# Patient Record
Sex: Female | Born: 1990 | Race: White | Hispanic: No | Smoking: Former smoker
Health system: Southern US, Community
[De-identification: ages and names within clinical notes are randomized; demographics above are authoritative.]

## PROBLEM LIST (undated history)

## (undated) ENCOUNTER — Inpatient Hospital Stay (HOSPITAL_COMMUNITY): Payer: Self-pay

## (undated) DIAGNOSIS — N393 Stress incontinence (female) (male): Secondary | ICD-10-CM

## (undated) DIAGNOSIS — J4540 Moderate persistent asthma, uncomplicated: Secondary | ICD-10-CM

## (undated) DIAGNOSIS — N3281 Overactive bladder: Secondary | ICD-10-CM

## (undated) DIAGNOSIS — Z8742 Personal history of other diseases of the female genital tract: Secondary | ICD-10-CM

## (undated) DIAGNOSIS — Z973 Presence of spectacles and contact lenses: Secondary | ICD-10-CM

## (undated) DIAGNOSIS — J45909 Unspecified asthma, uncomplicated: Secondary | ICD-10-CM

## (undated) DIAGNOSIS — J302 Other seasonal allergic rhinitis: Secondary | ICD-10-CM

## (undated) DIAGNOSIS — N8110 Cystocele, unspecified: Secondary | ICD-10-CM

## (undated) DIAGNOSIS — N812 Incomplete uterovaginal prolapse: Secondary | ICD-10-CM

## (undated) HISTORY — DX: Cystocele, unspecified: N81.10

## (undated) HISTORY — PX: WISDOM TOOTH EXTRACTION: SHX21

## (undated) HISTORY — PX: ABDOMINAL HYSTERECTOMY: SHX81

## (undated) HISTORY — DX: Unspecified asthma, uncomplicated: J45.909

---
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
## 2011-02-18 ENCOUNTER — Other Ambulatory Visit: Payer: Self-pay | Admitting: Family Medicine

## 2011-02-18 MED ORDER — LORATADINE 10 MG PO CAPS: ORAL_CAPSULE | ORAL | Status: DC

## 2011-03-14 ENCOUNTER — Ambulatory Visit (INDEPENDENT_AMBULATORY_CARE_PROVIDER_SITE_OTHER): Payer: BC Managed Care – PPO | Admitting: Family Medicine

## 2011-03-14 ENCOUNTER — Encounter: Payer: Self-pay | Admitting: Family Medicine

## 2011-03-14 DIAGNOSIS — R82998 Other abnormal findings in urine: Secondary | ICD-10-CM

## 2011-03-14 DIAGNOSIS — R8290 Unspecified abnormal findings in urine: Secondary | ICD-10-CM

## 2011-03-14 DIAGNOSIS — M549 Dorsalgia, unspecified: Secondary | ICD-10-CM | POA: Insufficient documentation

## 2011-03-14 LAB — POCT URINALYSIS DIPSTICK
Bilirubin, UA: NEGATIVE
Blood, UA: NEGATIVE
Glucose, UA: NEGATIVE
Ketones, UA: NEGATIVE
Leukocytes, UA: NEGATIVE
Nitrite, UA: NEGATIVE
Protein, UA: NEGATIVE
Spec Grav, UA: 1.01
Urobilinogen, UA: 0.2
pH, UA: 7.5

## 2011-03-14 MED ORDER — CYCLOBENZAPRINE HCL 10 MG PO TABS: 10.0000 mg | ORAL_TABLET | Freq: Three times a day (TID) | ORAL | Status: AC | PRN

## 2011-03-14 MED ORDER — NAPROXEN 500 MG PO TABS: 500.0000 mg | ORAL_TABLET | Freq: Two times a day (BID) | ORAL | Status: AC

## 2011-03-14 NOTE — Progress Notes (Signed)
  Subjective:    Patient ID: Amber Wilkins, female    DOB: Oct 04, 1991, 20 y.o.   MRN: [id number]  HPI ? UTI- back pain started 1 week ago.  No decreased ROM but + pain w/ movement.  No change in activity recently- denies heavy lifting.  'normal exercise'.  No fevers or chills.  About to start period.  Denies increased frequency, dysuria, blood.  + cloudy urine and strong smelling.  Reports normal water intake.   Review of Systems ROS  For ROS see HPI     Objective:   Physical Exam  Constitutional: She appears well-developed and well-nourished. No distress.  Abdominal: Soft. She exhibits no distension. There is no tenderness.       No suprapubic or CVA tenderness  Musculoskeletal:       (-) SLR Pain w/ extension>flexion, good range of motion in flexion and extension Strength normal in LEs, sensation intact          Assessment & Plan:

## 2011-03-14 NOTE — Patient Instructions (Signed)
This appears to be all muscle related Take the muscle relaxers at night and on weekend- they will make you sleepy Use the Naproxen for 7-10 days (take w/ food) and then as needed for pain Continue to drink plenty of fluids Use a heating pad for pain relief Call with any questions or concerns Hang in there!!!

## 2011-03-20 NOTE — Assessment & Plan Note (Signed)
Pain and sxs not consistent w/ UTI.  UA not suspicious for infxn.  Back pain more muscular in nature.  Start NSAIDs, muscle relaxer for night.  Reviewed supportive care and red flags that should prompt return.  Pt expressed understanding and is in agreement w/ plan.

## 2011-04-08 NOTE — Op Note (Signed)
[facility]  Patient:    Amber Wilkins, Amber Wilkins Visit Number: [id number] MRN: [id number]          Service Type: PED Location: PEDS [phone] [id number] Attending Physician:  Devoria Albe Dictated by:   Judie Petit. Leonia Corona, M.D. Proc. Date: 09/25/01 Admit Date:  09/24/2001                             Operative Report  PREOPERATIVE DIAGNOSIS:  Acute appendicitis.  POSTOPERATIVE DIAGNOSIS:  Acute appendicitis.  PROCEDURE:  Open appendectomy.  SURGEON:  Nelida Meuse, M.D.  ASSISTANT:  Nurse.  DESCRIPTION OF PROCEDURE:  The patient is brought in the operating room, placed supine upon the operating table.  General endotracheal tube anesthesia is given.  The right lower quadrant of the abdomen and the surrounding area of the abdominal wall is cleaned, prepped, and draped in the usual manner.  The incision is entered at the McBurney point and a curvilinear incision along the skin crease is made, measuring about 3-4 cm.  The skin incision is deepened through subcutaneous tissues using electrocautery until the external aponeurosis is exposed.  The external aponeurosis is incised in the line of its fibers using knife and scissors, incising the external oblique aponeurosis.  The internal oblique and the transversus abdominis fibers are split along their fibers with the help of a blunt-tipped hemostat and then retracting the fibers with the help of [employer] retractor blade.  After retracting the internal and transversus abdominis muscle fibers, the peritoneum is visualized with the help of two hemostats and incising in between with the help of scissors.  A serosanguineous fluid exuded immediately upon opening the peritoneum, and the omentum was visualized just beneath, confirming the presence of an acute inflammatory deposit.  The blades of the retractor were inserted into the peritoneal cavity and stretched.  The fluid was suctioned out.  The right index finger was  introduced to feel for the appendix.  The appendix was found to be retrocecal, severely inflamed, with a bulbous and swollen tip.  Mobilization of the cecum did not help enough to deliver the appendix, which completely hidden behind the peritoneum.  We therefore held the cecum and the base of the appendix, which was partly delivered through the incision.  Base of the appendix was cleared with the help of a blunt-tipped hemostat and ligated below a clamp using 2-0 Vicryl and then divided, and after dividing the base of the appendix on the cecal wall, it was easier for [place] to do a retrograde appendectomy.  We carefully packed the cecum off and then started dividing the mesoappendix and the small bits using ligature of 3-0 silk and dividing until the mesoappendix was completely divided and the appendix was gradually freed from the retroperitoneum.  Once partially freed, the tip was held up with [place] and then the _____ appendix was pulled partially through the wound and the remaining attachment with the mesoappendix was clamped and divided.  The appendix was freed from the _____ and removed from the field, and mesoappendix was ligated using 3-0 silk.  Complete hemostasis was noticed.  There was no oozing or bleeding noted.  The peritoneum was irrigated with warm saline and completely suctioned out.  Peritoneal fluid was clear.  The base of the appendix was once again held up and a pursestring suture using 3-0 silk was made, and the stump was buried by tightening the pursestring suture, and the cecum  was returned back into the peritoneal cavity once again.  The anterior of the peritoneum was inspected for any oozing or bleeding, and none was noted.  Once again the peritoneal cavity was irrigated and suctioned out completely, and then the peritoneum was closed using 2-0 Vicryl running stitch and the internal oblique and transversus abdominis muscles were approximated using three  interrupted sutures of 2-0 Vicryl.  The external oblique aponeurosis was repaired using 2-0 Vicryl interrupted stitch.  The wound was once again irrigated out and then approximately 15 cc of 0.25% Marcaine with epinephrine was infiltrated in and around the incision for postoperative pain control.  The wound was closed in layers, the subcutaneous layer using 4-0 Vicryl interrupted stitches and a 5-0 Monocryl subcuticular stitch.  Steri-Strips were applied, which was covered with sterile gauze.  The patient tolerated the procedure very well, which was smooth and uneventful.  The patient was later extubated and transported to the recovery room in good and stable condition. Dictated by:   Judie Petit. Leonia Corona, M.D. Attending Physician:  Devoria Albe DD:  09/25/01 TD:  09/26/01 Job: [id number]

## 2011-12-21 ENCOUNTER — Other Ambulatory Visit: Payer: Self-pay | Admitting: Family Medicine

## 2011-12-22 NOTE — Telephone Encounter (Signed)
.  left message to have patient return my call. Per noted pt has not had pap smear and last one noted with GYN,need to clarify if she has a GYN

## 2011-12-26 NOTE — Telephone Encounter (Signed)
Called pt and spoke to pt mother, I advised I was [facility] nurse and that I was calling to speak with pt, mother asked if this was about her birth control pills because she figured out that the fax must have went to [facility], however her mother is not listed as a contact to give information to, advised to have pt call office with any questions or concerns, pt mother advised she would tell pt

## 2013-08-12 ENCOUNTER — Encounter (HOSPITAL_COMMUNITY): Payer: Self-pay

## 2013-08-12 ENCOUNTER — Inpatient Hospital Stay (HOSPITAL_COMMUNITY)
Admission: AD | Admit: 2013-08-12 | Discharge: 2013-08-12 | Disposition: A | Discharge status: Home / Self Care | Payer: Medicaid Other | Source: Ambulatory Visit | Attending: Obstetrics and Gynecology | Admitting: Obstetrics and Gynecology

## 2013-08-12 DIAGNOSIS — Z3201 Encounter for pregnancy test, result positive: Secondary | ICD-10-CM

## 2013-08-12 HISTORY — DX: Unspecified asthma, uncomplicated: J45.909

## 2013-08-12 HISTORY — DX: Other seasonal allergic rhinitis: J30.2

## 2013-08-12 NOTE — MAU Note (Signed)
Patient states she has had 2 positive home pregnancy tests. Wants confirmation. Denies pain, bleeding or discharge. Has some nausea, no vomiting.

## 2013-08-12 NOTE — MAU Provider Note (Signed)
Chief Complaint: Possible Pregnancy   First Provider Initiated Contact with Patient 08/12/13 1714     SUBJECTIVE HPI: Amber Wilkins is a 22 y.o. G1P0 at [redacted]w[redacted]d by LMP who presents to maternity admissions reporting positive HPT.  She also has cough with congestion which has improved in last 24 hours.  She denies abdominal pain, vaginal bleeding, vaginal itching/burning, urinary symptoms, h/a, dizziness, n/v, or fever/chills.    Past Medical History  Diagnosis Date  . Seasonal allergies   . Bronchitis, allergic    Past Surgical History  Procedure Laterality Date  . Appendectomy  2002   History   Social History  . Marital Status: Single    Spouse Name: N/A    Number of Children: N/A  . Years of Education: N/A   Occupational History  . Not on file.   Social History Main Topics  . Smoking status: Former Smoker -- 0.25 packs/day for 1 years    Types: Cigarettes    Quit date: 07/12/2013  . Smokeless tobacco: Not on file  . Alcohol Use: No  . Drug Use: Yes    Special: Marijuana     Comment: august 2014  . Sexual Activity: Yes   Other Topics Concern  . Not on file   Social History Narrative   Lives with mom and dad.    Student at [employer].   No current facility-administered medications on file prior to encounter.   No current outpatient prescriptions on file prior to encounter.   Allergies  Allergen Reactions  . Penicillins Hives and Nausea And Vomiting    ROS: Pertinent items in HPI  OBJECTIVE Blood pressure 137/82, pulse 68, temperature 97.8 F (36.6 C), temperature source Oral, resp. rate 16, height 5' 1.75" (1.568 m), weight 158 lb (71.668 kg), last menstrual period 06/22/2013, SpO2 100.00%. GENERAL: Well-developed, well-nourished female in no acute distress.  HEENT: Normocephalic HEART: normal rate RESP: normal effort, lung sounds clear and equal bilaterally ABDOMEN: Soft, non-tender EXTREMITIES: Nontender, no edema NEURO: Alert and oriented SPECULUM  EXAM: deferred  LAB RESULTS Results for orders placed during the hospital encounter of 08/12/13 (from the past 24 hour(s))  POCT PREGNANCY, URINE     Status: Abnormal   Collection Time    08/12/13  4:02 PM      Result Value Range   Preg Test, Ur POSITIVE (*) NEGATIVE    ASSESSMENT 1. Positive pregnancy test     PLAN Message sent to [facility] office to make prenatal appointment Discharge home Pregnancy verification letter provided Drink plenty of water, antihistamines Ok (Benadryl, Zyrtec, Claritin) and Tylenol as needed Return to MAU as needed       Follow-up Information   Follow up with [facility]. (The clinic will call you with appointment or call the number below.  Return to MAU as needed.)    Specialty:  Obstetrics and Gynecology   Contact information:   [address] [place] [address] [phone]      Sharen Counter Certified Nurse-Midwife 08/12/2013  5:42 PM

## 2013-08-14 NOTE — MAU Provider Note (Signed)
Attestation of Attending Supervision of Advanced Practitioner (CNM/NP): Evaluation and management procedures were performed by the Advanced Practitioner under my supervision and collaboration.  I have reviewed the Advanced Practitioner's note and chart, and I agree with the management and plan.  Teruko Joswick 08/14/2013 10:29 AM

## 2013-08-26 ENCOUNTER — Encounter: Payer: Self-pay | Admitting: *Deleted

## 2013-08-26 ENCOUNTER — Ambulatory Visit (INDEPENDENT_AMBULATORY_CARE_PROVIDER_SITE_OTHER): Payer: Medicaid Other | Admitting: *Deleted

## 2013-08-26 VITALS — BP 122/66 | Wt 161.0 lb

## 2013-08-26 DIAGNOSIS — Z3401 Encounter for supervision of normal first pregnancy, first trimester: Secondary | ICD-10-CM

## 2013-08-26 DIAGNOSIS — Z3400 Encounter for supervision of normal first pregnancy, unspecified trimester: Secondary | ICD-10-CM

## 2013-08-26 LAB — HIV ANTIBODY (ROUTINE TESTING W REFLEX): HIV: NONREACTIVE

## 2013-08-26 NOTE — Patient Instructions (Signed)
Pregnancy - First Trimester  During sexual intercourse, millions of sperm go into the vagina. Only 1 sperm will penetrate and fertilize the female egg while it is in the Fallopian tube. One week later, the fertilized egg implants into the wall of the uterus. An embryo begins to develop into a baby. At 6 to 8 weeks, the eyes and face are formed and the heartbeat can be seen on ultrasound. At the end of 12 weeks (first trimester), all the baby's organs are formed. Now that you are pregnant, you will want to do everything you can to have a healthy baby. Two of the most important things are to get good prenatal care and follow your caregiver's instructions. Prenatal care is all the medical care you receive before the baby's birth. It is given to prevent, find, and treat problems during the pregnancy and childbirth.  PRENATAL EXAMS  · During prenatal visits, your weight, blood pressure, and urine are checked. This is done to make sure you are healthy and progressing normally during the pregnancy.  · A pregnant woman should gain 25 to 35 pounds during the pregnancy. However, if you are overweight or underweight, your caregiver will advise you regarding your weight.  · Your caregiver will ask and answer questions for you.  · Blood work, cervical cultures, other necessary tests, and a Pap test are done during your prenatal exams. These tests are done to check on your health and the probable health of your baby. Tests are strongly recommended and done for HIV with your permission. This is the virus that causes AIDS. These tests are done because medicines can be given to help prevent your baby from being born with this infection should you have been infected without knowing it. Blood work is also used to find out your blood type, previous infections, and follow your blood levels (hemoglobin).  · Low hemoglobin (anemia) is common during pregnancy. Iron and vitamins are given to help prevent this. Later in the pregnancy, blood  tests for diabetes will be done along with any other tests if any problems develop.  · You may need other tests to make sure you and the baby are doing well.  CHANGES DURING THE FIRST TRIMESTER   Your body goes through many changes during pregnancy. They vary from person to person. Talk to your caregiver about changes you notice and are concerned about. Changes can include:  · Your menstrual period stops.  · The egg and sperm carry the genes that determine what you look like. Genes from you and your partner are forming a baby. The female genes determine whether the baby is a boy or a girl.  · Your body increases in girth and you may feel bloated.  · Feeling sick to your stomach (nauseous) and throwing up (vomiting). If the vomiting is uncontrollable, call your caregiver.  · Your breasts will begin to enlarge and become tender.  · Your nipples may stick out more and become darker.  · The need to urinate more. Painful urination may mean you have a bladder infection.  · Tiring easily.  · Loss of appetite.  · Cravings for certain kinds of food.  · At first, you may gain or lose a couple of pounds.  · You may have changes in your emotions from day to day (excited to be pregnant or concerned something may go wrong with the pregnancy and baby).  · You may have more vivid and strange dreams.  HOME CARE INSTRUCTIONS   ·   It is very important to avoid all smoking, alcohol and non-prescribed drugs during your pregnancy. These affect the formation and growth of the baby. Avoid chemicals while pregnant to ensure the delivery of a healthy infant.  · Start your prenatal visits by the 12th week of pregnancy. They are usually scheduled monthly at first, then more often in the last 2 months before delivery. Keep your caregiver's appointments. Follow your caregiver's instructions regarding medicine use, blood and lab tests, exercise, and diet.  · During pregnancy, you are providing food for you and your baby. Eat regular, well-balanced  meals. Choose foods such as meat, fish, milk and other low fat dairy products, vegetables, fruits, and whole-grain breads and cereals. Your caregiver will tell you of the ideal weight gain.  · You can help morning sickness by keeping soda crackers at the bedside. Eat a couple before arising in the morning. You may want to use the crackers without salt on them.  · Eating 4 to 5 small meals rather than 3 large meals a day also may help the nausea and vomiting.  · Drinking liquids between meals instead of during meals also seems to help nausea and vomiting.  · A physical sexual relationship may be continued throughout pregnancy if there are no other problems. Problems may be early (premature) leaking of amniotic fluid from the membranes, vaginal bleeding, or belly (abdominal) pain.  · Exercise regularly if there are no restrictions. Check with your caregiver or physical therapist if you are unsure of the safety of some of your exercises. Greater weight gain will occur in the last 2 trimesters of pregnancy. Exercising will help:  · Control your weight.  · Keep you in shape.  · Prepare you for labor and delivery.  · Help you lose your pregnancy weight after you deliver your baby.  · Wear a good support or jogging bra for breast tenderness during pregnancy. This may help if worn during sleep too.  · Ask when prenatal classes are available. Begin classes when they are offered.  · Do not use hot tubs, steam rooms, or saunas.  · Wear your seat belt when driving. This protects you and your baby if you are in an accident.  · Avoid raw meat, uncooked cheese, cat litter boxes, and soil used by cats throughout the pregnancy. These carry germs that can cause birth defects in the baby.  · The first trimester is a good time to visit your dentist for your dental health. Getting your teeth cleaned is okay. Use a softer toothbrush and brush gently during pregnancy.  · Ask for help if you have financial, counseling, or nutritional needs  during pregnancy. Your caregiver will be able to offer counseling for these needs as well as refer you for other special needs.  · Do not take any medicines or herbs unless told by your caregiver.  · Inform your caregiver if there is any mental or physical domestic violence.  · Make a list of emergency phone numbers of family, friends, hospital, and police and fire departments.  · Write down your questions. Take them to your prenatal visit.  · Do not douche.  · Do not cross your legs.  · If you have to stand for long periods of time, rotate you feet or take small steps in a circle.  · You may have more vaginal secretions that may require a sanitary pad. Do not use tampons or scented sanitary pads.  MEDICINES AND DRUG USE IN PREGNANCY  ·   Take prenatal vitamins as directed. The vitamin should contain 1 milligram of folic acid. Keep all vitamins out of reach of children. Only a couple vitamins or tablets containing iron may be fatal to a baby or young child when ingested.  · Avoid use of all medicines, including herbs, over-the-counter medicines, not prescribed or suggested by your caregiver. Only take over-the-counter or prescription medicines for pain, discomfort, or fever as directed by your caregiver. Do not use aspirin, ibuprofen, or naproxen unless directed by your caregiver.  · Let your caregiver also know about herbs you may be using.  · Alcohol is related to a number of birth defects. This includes fetal alcohol syndrome. All alcohol, in any form, should be avoided completely. Smoking will cause low birth rate and premature babies.  · Street or illegal drugs are very harmful to the baby. They are absolutely forbidden. A baby born to an addicted mother will be addicted at birth. The baby will go through the same withdrawal an adult does.  · Let your caregiver know about any medicines that you have to take and for what reason you take them.  SEEK MEDICAL CARE IF:   You have any concerns or worries during your  pregnancy. It is better to call with your questions if you feel they cannot wait, rather than worry about them.  SEEK IMMEDIATE MEDICAL CARE IF:   · An unexplained oral temperature above 102° F (38.9° C) develops, or as your caregiver suggests.  · You have leaking of fluid from the vagina (birth canal). If leaking membranes are suspected, take your temperature and inform your caregiver of this when you call.  · There is vaginal spotting or bleeding. Notify your caregiver of the amount and how many pads are used.  · You develop a bad smelling vaginal discharge with a change in the color.  · You continue to feel sick to your stomach (nauseated) and have no relief from remedies suggested. You vomit blood or coffee ground-like materials.  · You lose more than 2 pounds of weight in 1 week.  · You gain more than 2 pounds of weight in 1 week and you notice swelling of your face, hands, feet, or legs.  · You gain 5 pounds or more in 1 week (even if you do not have swelling of your hands, face, legs, or feet).  · You get exposed to German measles and have never had them.  · You are exposed to fifth disease or chickenpox.  · You develop belly (abdominal) pain. Round ligament discomfort is a common non-cancerous (benign) cause of abdominal pain in pregnancy. Your caregiver still must evaluate this.  · You develop headache, fever, diarrhea, pain with urination, or shortness of breath.  · You fall or are in a car accident or have any kind of trauma.  · There is mental or physical violence in your home.  Document Released: 11/01/2001 Document Revised: 08/01/2012 Document Reviewed: 05/05/2009  [employer]® Patient Information ©2014 [employer].

## 2013-08-26 NOTE — Progress Notes (Signed)
Patient is here today for New OB visit.  She is doing well with just a little bit of nausea in the mornings.  She feels like it is stomach acid related so she will try an anti acid to see if this helps and will let us know at her next visit.  Bedside ultrasound shows crown rump length of 7weeks 2 days, which differs from her LMP by two weeks.  She would like to have first trimester screening and will set that up at her next visit with the physician.  Routine blood work was drawn and patient will follow up next week to see the physician and she will call if she has any further questions or concerns before her next visit.

## 2013-08-27 LAB — OBSTETRIC PANEL
Antibody Screen: NEGATIVE
Basophils Relative: 0 % (ref 0–1)
Eosinophils Absolute: 0.1 10*3/uL (ref 0.0–0.7)
Eosinophils Relative: 1 % (ref 0–5)
HCT: 39.2 % (ref 36.0–46.0)
Hemoglobin: 13.6 g/dL (ref 12.0–15.0)
MCH: 32.9 pg (ref 26.0–34.0)
MCHC: 34.7 g/dL (ref 30.0–36.0)
Monocytes Absolute: 0.7 10*3/uL (ref 0.1–1.0)
Monocytes Relative: 7 % (ref 3–12)
Platelets: 326 10*3/uL (ref 150–400)
Rh Type: POSITIVE
WBC: 10.3 10*3/uL (ref 4.0–10.5)

## 2013-08-27 LAB — CULTURE, OB URINE: Colony Count: NO GROWTH

## 2013-08-29 LAB — CYSTIC FIBROSIS DIAGNOSTIC STUDY

## 2013-09-03 ENCOUNTER — Encounter: Payer: Self-pay | Admitting: Obstetrics & Gynecology

## 2013-09-03 ENCOUNTER — Ambulatory Visit (INDEPENDENT_AMBULATORY_CARE_PROVIDER_SITE_OTHER): Payer: Medicaid Other | Admitting: Obstetrics & Gynecology

## 2013-09-03 VITALS — BP 103/61 | Wt 158.0 lb

## 2013-09-03 DIAGNOSIS — N898 Other specified noninflammatory disorders of vagina: Secondary | ICD-10-CM

## 2013-09-03 DIAGNOSIS — Z3490 Encounter for supervision of normal pregnancy, unspecified, unspecified trimester: Secondary | ICD-10-CM | POA: Insufficient documentation

## 2013-09-03 DIAGNOSIS — O9989 Other specified diseases and conditions complicating pregnancy, childbirth and the puerperium: Secondary | ICD-10-CM

## 2013-09-03 DIAGNOSIS — Z3491 Encounter for supervision of normal pregnancy, unspecified, first trimester: Secondary | ICD-10-CM

## 2013-09-03 DIAGNOSIS — Z3480 Encounter for supervision of other normal pregnancy, unspecified trimester: Secondary | ICD-10-CM

## 2013-09-03 NOTE — Patient Instructions (Signed)
Pregnancy - First Trimester During sexual intercourse, millions of sperm go into the vagina. Only 1 sperm will penetrate and fertilize the female egg while it is in the Fallopian tube. One week later, the fertilized egg implants into the wall of the uterus. An embryo begins to develop into a baby. At 6 to 8 weeks, the eyes and face are formed and the heartbeat can be seen on ultrasound. At the end of 12 weeks (first trimester), all the baby's organs are formed. Now that you are pregnant, you will want to do everything you can to have a healthy baby. Two of the most important things are to get good prenatal care and follow your caregiver's instructions. Prenatal care is all the medical care you receive before the baby's birth. It is given to prevent, find, and treat problems during the pregnancy and childbirth. PRENATAL EXAMS  During prenatal visits, your weight, blood pressure, and urine are checked. This is done to make sure you are healthy and progressing normally during the pregnancy.  A pregnant woman should gain 25 to 35 pounds during the pregnancy. However, if you are overweight or underweight, your caregiver will advise you regarding your weight.  Your caregiver will ask and answer questions for you.  Blood work, cervical cultures, other necessary tests, and a Pap test are done during your prenatal exams. These tests are done to check on your health and the probable health of your baby. Tests are strongly recommended and done for HIV with your permission. This is the virus that causes AIDS. These tests are done because medicines can be given to help prevent your baby from being born with this infection should you have been infected without knowing it. Blood work is also used to find out your blood type, previous infections, and follow your blood levels (hemoglobin).  Low hemoglobin (anemia) is common during pregnancy. Iron and vitamins are given to help prevent this. Later in the pregnancy,  blood tests for diabetes will be done along with any other tests if any problems develop.  You may need other tests to make sure you and the baby are doing well. CHANGES DURING THE FIRST TRIMESTER  Your body goes through many changes during pregnancy. They vary from person to person. Talk to your caregiver about changes you notice and are concerned about. Changes can include:  Your menstrual period stops.  The egg and sperm carry the genes that determine what you look like. Genes from you and your partner are forming a baby. The female genes determine whether the baby is a boy or a girl.  Your body increases in girth and you may feel bloated.  Feeling sick to your stomach (nauseous) and throwing up (vomiting). If the vomiting is uncontrollable, call your caregiver.  Your breasts will begin to enlarge and become tender.  Your nipples may stick out more and become darker.  The need to urinate more. Painful urination may mean you have a bladder infection.  Tiring easily.  Loss of appetite.  Cravings for certain kinds of food.  At first, you may gain or lose a couple of pounds.  You may have changes in your emotions from day to day (excited to be pregnant or concerned something may go wrong with the pregnancy and baby).  You may have more vivid and strange dreams. HOME CARE INSTRUCTIONS   It is very important to avoid all smoking, alcohol and non-prescribed drugs during your pregnancy. These affect the formation and growth of the baby.   Avoid chemicals while pregnant to ensure the delivery of a healthy infant.  Start your prenatal visits by the 12th week of pregnancy. They are usually scheduled monthly at first, then more often in the last 2 months before delivery. Keep your caregiver's appointments. Follow your caregiver's instructions regarding medicine use, blood and lab tests, exercise, and diet.  During pregnancy, you are providing food for you and your baby. Eat regular,  well-balanced meals. Choose foods such as meat, fish, milk and other low fat dairy products, vegetables, fruits, and whole-grain breads and cereals. Your caregiver will tell you of the ideal weight gain.  You can help morning sickness by keeping soda crackers at the bedside. Eat a couple before arising in the morning. You may want to use the crackers without salt on them.  Eating 4 to 5 small meals rather than 3 large meals a day also may help the nausea and vomiting.  Drinking liquids between meals instead of during meals also seems to help nausea and vomiting.  A physical sexual relationship may be continued throughout pregnancy if there are no other problems. Problems may be early (premature) leaking of amniotic fluid from the membranes, vaginal bleeding, or belly (abdominal) pain.  Exercise regularly if there are no restrictions. Check with your caregiver or physical therapist if you are unsure of the safety of some of your exercises. Greater weight gain will occur in the last 2 trimesters of pregnancy. Exercising will help:  Control your weight.  Keep you in shape.  Prepare you for labor and delivery.  Help you lose your pregnancy weight after you deliver your baby.  Wear a good support or jogging bra for breast tenderness during pregnancy. This may help if worn during sleep too.  Ask when prenatal classes are available. Begin classes when they are offered.  Do not use hot tubs, steam rooms, or saunas.  Wear your seat belt when driving. This protects you and your baby if you are in an accident.  Avoid raw meat, uncooked cheese, cat litter boxes, and soil used by cats throughout the pregnancy. These carry germs that can cause birth defects in the baby.  The first trimester is a good time to visit your dentist for your dental health. Getting your teeth cleaned is okay. Use a softer toothbrush and brush gently during pregnancy.  Ask for help if you have financial, counseling, or  nutritional needs during pregnancy. Your caregiver will be able to offer counseling for these needs as well as refer you for other special needs.  Do not take any medicines or herbs unless told by your caregiver.  Inform your caregiver if there is any mental or physical domestic violence.  Make a list of emergency phone numbers of family, friends, hospital, and police and fire departments.  Write down your questions. Take them to your prenatal visit.  Do not douche.  Do not cross your legs.  If you have to stand for long periods of time, rotate you feet or take small steps in a circle.  You may have more vaginal secretions that may require a sanitary pad. Do not use tampons or scented sanitary pads. MEDICINES AND DRUG USE IN PREGNANCY  Take prenatal vitamins as directed. The vitamin should contain 1 milligram of folic acid. Keep all vitamins out of reach of children. Only a couple vitamins or tablets containing iron may be fatal to a baby or young child when ingested.  Avoid use of all medicines, including herbs, over-the-counter medicines, not   prescribed or suggested by your caregiver. Only take over-the-counter or prescription medicines for pain, discomfort, or fever as directed by your caregiver. Do not use aspirin, ibuprofen, or naproxen unless directed by your caregiver.  Let your caregiver also know about herbs you may be using.  Alcohol is related to a number of birth defects. This includes fetal alcohol syndrome. All alcohol, in any form, should be avoided completely. Smoking will cause low birth rate and premature babies.  Street or illegal drugs are very harmful to the baby. They are absolutely forbidden. A baby born to an addicted mother will be addicted at birth. The baby will go through the same withdrawal an adult does.  Let your caregiver know about any medicines that you have to take and for what reason you take them. SEEK MEDICAL CARE IF:  You have any concerns or  worries during your pregnancy. It is better to call with your questions if you feel they cannot wait, rather than worry about them. SEEK IMMEDIATE MEDICAL CARE IF:   An unexplained oral temperature above 102 F (38.9 C) develops, or as your caregiver suggests.  You have leaking of fluid from the vagina (birth canal). If leaking membranes are suspected, take your temperature and inform your caregiver of this when you call.  There is vaginal spotting or bleeding. Notify your caregiver of the amount and how many pads are used.  You develop a bad smelling vaginal discharge with a change in the color.  You continue to feel sick to your stomach (nauseated) and have no relief from remedies suggested. You vomit blood or coffee ground-like materials.  You lose more than 2 pounds of weight in 1 week.  You gain more than 2 pounds of weight in 1 week and you notice swelling of your face, hands, feet, or legs.  You gain 5 pounds or more in 1 week (even if you do not have swelling of your hands, face, legs, or feet).  You get exposed to [place] measles and have never had them.  You are exposed to fifth disease or chickenpox.  You develop belly (abdominal) pain. Round ligament discomfort is a common non-cancerous (benign) cause of abdominal pain in pregnancy. Your caregiver still must evaluate this.  You develop headache, fever, diarrhea, pain with urination, or shortness of breath.  You fall or are in a car accident or have any kind of trauma.  There is mental or physical violence in your home. Document Released: 11/01/2001 Document Revised: 08/01/2012 Document Reviewed: 05/05/2009 [facility] Patient Information 2014 [place], [place].   Influenza Vaccine (Flu Vaccine, Inactivated) 2013 2014 What You Need to Know WHY GET VACCINATED?  Influenza ("flu") is a contagious disease that spreads around the [place] every winter, usually between October and May.  Flu is caused by the influenza  virus, and can be spread by coughing, sneezing, and close contact.  Anyone can get flu, but the risk of getting flu is highest among children. Symptoms come on suddenly and may last several days. They can include:  Fever or chills.  Sore throat.  Muscle aches.  Fatigue.  Cough.  Headache.  Runny or stuffy nose. Flu can make some people much sicker than others. These people include young children, people 2 and older, pregnant women, and people with certain health conditions such as heart, lung or kidney disease, or a weakened immune system. Flu vaccine is especially important for these people, and anyone in close contact with them. Flu can also lead to pneumonia,  and make existing medical conditions worse. It can cause diarrhea and seizures in children. Each year thousands of people in the [place] die from flu, and many more are hospitalized. Flu vaccine is the best protection we have from flu and its complications. Flu vaccine also helps prevent spreading flu from person to person. INACTIVATED FLU VACCINE There are 2 types of influenza vaccine:  You are getting an inactivated flu vaccine, which does not contain any live influenza virus. It is given by injection with a needle, and often called the "flu shot."  A different live, attenuated (weakened) influenza vaccine is sprayed into the nostrils. This vaccine is described in a separate Vaccine Information Statement. Flu vaccine is recommended every year. Children 6 months through 44 years of age should get 2 doses the first year they get vaccinated. Flu viruses are always changing. Each year's flu vaccine is made to protect from viruses that are most likely to cause disease that year. While flu vaccine cannot prevent all cases of flu, it is our best defense against the disease. Inactivated flu vaccine protects against 3 or 4 different influenza viruses. It takes about 2 weeks for protection to develop after the vaccination, and  protection lasts several months to a year. Some illnesses that are not caused by influenza virus are often mistaken for flu. Flu vaccine will not prevent these illnesses. It can only prevent influenza. A "high-dose" flu vaccine is available for people 80 years of age and older. The person giving you the vaccine can tell you more about it. Some inactivated flu vaccine contains a very small amount of a mercury-based preservative called thimerosal. Studies have shown that thimerosal in vaccines is not harmful, but flu vaccines that do not contain a preservative are available. SOME PEOPLE SHOULD NOT GET THIS VACCINE Tell the person who gives you the vaccine:  If you have any severe (life-threatening) allergies. If you ever had a life-threatening allergic reaction after a dose of flu vaccine, or have a severe allergy to any part of this vaccine, you may be advised not to get a dose. Most, but not all, types of flu vaccine contain a small amount of egg.  If you ever had Guillain Barr Syndrome (a severe paralyzing illness, also called GBS). Some people with a history of GBS should not get this vaccine. This should be discussed with your doctor.  If you are not feeling well. They might suggest waiting until you feel better. But you should come back. RISKS OF A VACCINE REACTION With a vaccine, like any medicine, there is a chance of side effects. These are usually mild and go away on their own. Serious side effects are also possible, but are very rare. Inactivated flu vaccine does not contain live flu virus, sogetting flu from this vaccine is not possible. Brief fainting spells and related symptoms (such as jerking movements) can happen after any medical procedure, including vaccination. Sitting or lying down for about 15 minutes after a vaccination can help prevent fainting and injuries caused by falls. Tell your doctor if you feel dizzy or lightheaded, or have vision changes or ringing in the ears. Mild  problems following inactivated flu vaccine:  Soreness, redness, or swelling where the shot was given.  Hoarseness; sore, red or itchy eyes; or cough.  Fever.  Aches.  Headache.  Itching.  Fatigue. If these problems occur, they usually begin soon after the shot and last 1 or 2 days. Moderate problems following inactivated flu vaccine:  Young  children who get inactivated flu vaccine and pneumococcal vaccine (PCV13) at the same time may be at increased risk for seizures caused by fever. Ask your doctor for more information. Tell your doctor if a child who is getting flu vaccine has ever had a seizure. Severe problems following inactivated flu vaccine:  A severe allergic reaction could occur after any vaccine (estimated less than 1 in a million doses).  There is a small possibility that inactivated flu vaccine could be associated with Guillan Barr Syndrome (GBS), no more than 1 or 2 cases per million people vaccinated. This is much lower than the risk of severe complications from flu, which can be prevented by flu vaccine. The safety of vaccines is always being monitored. For more information, visit: http://floyd.org/ WHAT IF THERE IS A SERIOUS REACTION? What should I look for?  Look for anything that concerns you, such as signs of a severe allergic reaction, very high fever, or behavior changes. Signs of a severe allergic reaction can include hives, swelling of the face and throat, difficulty breathing, a fast heartbeat, dizziness, and weakness. These would start a few minutes to a few hours after the vaccination. What should I do?  If you think it is a severe allergic reaction or other emergency that cannot wait, call 9 1 1  or get the person to the nearest hospital. Otherwise, call your doctor.  Afterward, the reaction should be reported to the Vaccine Adverse Event Reporting System (VAERS). Your doctor might file this report, or you can do it yourself through the VAERS  website at www.vaers.LAgents.no, or by calling 1-2364797521. VAERS is only for reporting reactions. They do not give medical advice. THE NATIONAL VACCINE INJURY COMPENSATION PROGRAM The National Vaccine Injury Compensation Program (VICP) is a federal program that was created to compensate people who may have been injured by certain vaccines. Persons who believe they may have been injured by a vaccine can learn about the program and about filing a claim by calling 1-(320)514-7900 or visiting the VICP website at SpiritualWord.at HOW CAN I LEARN MORE?  Ask your doctor.  Call your local or state health department.  Contact the Centers for Disease Control and Prevention (CDC):  Call 706-738-7206 (1-800-CDC-INFO) or  Visit CDC's website at BiotechRoom.com.cy CDC Inactivated Influenza Vaccine Interim VIS (06/15/12) Document Released: 09/01/2006 Document Revised: 08/01/2012 Document Reviewed: 06/15/2012 [facility] Patient Information 2014 [place], [place].

## 2013-09-03 NOTE — Progress Notes (Signed)
   Subjective:    Amber Wilkins is a G1P0 at [redacted]w[redacted]d being seen today for her first obstetrical visit.  Patient reports increased white-green vaginal discharge with odor, no pruritus. No other symptoms.  Filed Vitals:   09/03/13 0939  BP: 103/61  Weight: 158 lb (71.668 kg)    HISTORY: OB History  Gravida Para Term Preterm AB SAB TAB Ectopic Multiple Living  1             # Outcome Date GA Lbr Len/2nd Weight Sex Delivery Anes PTL Lv  1 CUR              Past Medical History  Diagnosis Date  . Seasonal allergies   . Bronchitis, allergic    Past Surgical History  Procedure Laterality Date  . Appendectomy  2002   Family History  Problem Relation Age of Onset  . Seizures Mother   . Ovarian cancer Mother   . Cervical cancer Mother   . Lupus Mother   . Kidney disease Mother   . Hypertension Father   . Hypertension Paternal Grandmother   . Stroke Paternal Grandfather   . Hypertension Paternal Grandfather   . Heart disease Maternal Grandmother      Exam    Uterus:     Pelvic Exam:    Perineum: No Hemorrhoids, Normal Perineum   Vulva: normal   Vagina:  normal mucosa, white discharge, GC/Chlam and wet prep done   Cervix: nulliparous appearance   Adnexa: normal adnexa and no mass, fullness, tenderness   Bony Pelvis: average  System: Breast:  normal appearance, no masses or tenderness   Skin: normal coloration and turgor, no rashes   Neurologic: oriented, normal   Extremities: normal strength, tone, and muscle mass   HEENT PERRLA   Mouth/Teeth mucous membranes moist, pharynx normal without lesions and dental hygiene good   Neck supple and no masses   Cardiovascular: regular rate and rhythm   Respiratory:  appears well, vitals normal, no respiratory distress, acyanotic, normal RR, chest clear, no wheezing, crepitations, rhonchi, normal symmetric air entry   Abdomen: soft, non-tender; bowel sounds normal; no masses,  no organomegaly   Urinary: urethral meatus normal       Assessment:    Pregnancy: G1P0 Patient Active Problem List   Diagnosis Date Noted  . Supervision of normal pregnancy 09/03/2013  Vaginal discharge    Plan:   Initial labs drawn reviewed, will follow up wet prep Continue prenatal vitamins. Problem list reviewed and updated. Genetic Screening discussed First Screen: ordered. Ultrasound discussed; fetal survey: will be ordered later. Follow up in 4 weeks.  Jaynie Collins, MD, FACOG Attending Obstetrician & Gynecologist Faculty Practice, [place] [facility] [place]

## 2013-09-03 NOTE — Progress Notes (Signed)
Patient is having a green discharge.

## 2013-09-04 LAB — GC/CHLAMYDIA PROBE AMP
CT Probe RNA: NEGATIVE
GC Probe RNA: NEGATIVE

## 2013-09-04 LAB — WET PREP, GENITAL: Trich, Wet Prep: NONE SEEN

## 2013-09-26 ENCOUNTER — Other Ambulatory Visit: Payer: Self-pay

## 2013-09-27 ENCOUNTER — Other Ambulatory Visit: Payer: Self-pay | Admitting: Obstetrics & Gynecology

## 2013-09-27 ENCOUNTER — Encounter: Payer: Self-pay | Admitting: Obstetrics & Gynecology

## 2013-09-27 ENCOUNTER — Ambulatory Visit (INDEPENDENT_AMBULATORY_CARE_PROVIDER_SITE_OTHER): Payer: Medicaid Other | Admitting: Obstetrics & Gynecology

## 2013-09-27 VITALS — BP 119/73 | Wt 160.2 lb

## 2013-09-27 DIAGNOSIS — Z23 Encounter for immunization: Secondary | ICD-10-CM

## 2013-09-27 DIAGNOSIS — Z3682 Encounter for antenatal screening for nuchal translucency: Secondary | ICD-10-CM

## 2013-09-27 DIAGNOSIS — Z3480 Encounter for supervision of other normal pregnancy, unspecified trimester: Secondary | ICD-10-CM

## 2013-09-27 DIAGNOSIS — Z3490 Encounter for supervision of normal pregnancy, unspecified, unspecified trimester: Secondary | ICD-10-CM

## 2013-09-27 NOTE — Progress Notes (Signed)
P-56

## 2013-09-27 NOTE — Progress Notes (Signed)
Routine visit. No problems. She has MFM for First screen next week. Flu vaccine today.

## 2013-10-01 ENCOUNTER — Ambulatory Visit (HOSPITAL_COMMUNITY): Payer: Medicaid Other

## 2013-10-04 ENCOUNTER — Ambulatory Visit (HOSPITAL_COMMUNITY)
Admission: RE | Admit: 2013-10-04 | Discharge: 2013-10-04 | Disposition: A | Discharge status: Home / Self Care | Payer: Medicaid Other | Source: Ambulatory Visit | Attending: Obstetrics & Gynecology | Admitting: Obstetrics & Gynecology

## 2013-10-04 ENCOUNTER — Ambulatory Visit (HOSPITAL_COMMUNITY): Admission: RE | Admit: 2013-10-04 | Payer: Medicaid Other | Source: Ambulatory Visit

## 2013-10-04 ENCOUNTER — Encounter (HOSPITAL_COMMUNITY): Payer: Self-pay

## 2013-10-04 ENCOUNTER — Other Ambulatory Visit: Payer: Self-pay | Admitting: Obstetrics & Gynecology

## 2013-10-04 DIAGNOSIS — Z3682 Encounter for antenatal screening for nuchal translucency: Secondary | ICD-10-CM

## 2013-10-04 DIAGNOSIS — Z3689 Encounter for other specified antenatal screening: Secondary | ICD-10-CM | POA: Insufficient documentation

## 2013-10-04 DIAGNOSIS — O351XX0 Maternal care for (suspected) chromosomal abnormality in fetus, not applicable or unspecified: Secondary | ICD-10-CM | POA: Insufficient documentation

## 2013-10-04 DIAGNOSIS — O3510X0 Maternal care for (suspected) chromosomal abnormality in fetus, unspecified, not applicable or unspecified: Secondary | ICD-10-CM | POA: Insufficient documentation

## 2013-10-25 ENCOUNTER — Encounter: Payer: Self-pay | Admitting: Obstetrics & Gynecology

## 2013-10-25 ENCOUNTER — Ambulatory Visit (INDEPENDENT_AMBULATORY_CARE_PROVIDER_SITE_OTHER): Payer: Medicaid Other | Admitting: Obstetrics & Gynecology

## 2013-10-25 VITALS — BP 141/83 | Wt 163.0 lb

## 2013-10-25 DIAGNOSIS — Z3490 Encounter for supervision of normal pregnancy, unspecified, unspecified trimester: Secondary | ICD-10-CM

## 2013-10-25 DIAGNOSIS — Z3480 Encounter for supervision of other normal pregnancy, unspecified trimester: Secondary | ICD-10-CM

## 2013-10-25 NOTE — Progress Notes (Signed)
P-58 

## 2013-10-29 LAB — ALPHA FETOPROTEIN, MATERNAL
AFP: 50.2 IU/mL
Curr Gest Age: 16.5 wks.days
MoM for AFP: 1.57
Open Spina bifida: NEGATIVE
Osb Risk: 1:2270 {titer}

## 2013-11-21 NOTE — L&D Delivery Note (Signed)
Delivery Note At 10:56 AM a viable female was delivered via Vaginal, Spontaneous Delivery (Presentation: OA).  APGAR: 9, 9; weight: TBD.   Placenta status: intact, Spontaneous.  Cord: 3 vessels with the following complications: None.  Anesthesia: Epidural  Episiotomy: None Lacerations: sulcal Suture Repair: 3.0 vicryl Est. Blood Loss (mL): 350cc  Mom to postpartum.  Baby to Couplet care / Skin to Skin.  Upon arrival patient was completely dilated, pushing with good effort, progressed to deliver a healthy baby girl without difficulty. Baby with good tone and place on maternal abdomen for oral suctioning, drying and stimulation. Delayed cord clamping performed and cut by FOB. Placenta delivered intact with 3V cord. Vaginal canal and perineum was inspected and mostly intact, except sulcal tear with active bleeding, repaired with vicryl figure of 8 stitch with hemostasis on completion. Pitocin was started and uterus massaged until bleeding slowed. Counts of sharps, instruments, and lap pads were all correct.    Saralyn PilarAlexander Karamalegos, DO [facility], PGY-1 03/21/2014, 11:42 AM   I was present for and supervised the delivery of this newborn. I agree with above documentation.   Vale HavenKeli L Deaire Mcwhirter, MD

## 2013-11-25 ENCOUNTER — Ambulatory Visit (HOSPITAL_COMMUNITY)
Admission: RE | Admit: 2013-11-25 | Discharge: 2013-11-25 | Disposition: A | Discharge status: Home / Self Care | Payer: Medicaid Other | Source: Ambulatory Visit | Attending: Obstetrics & Gynecology | Admitting: Obstetrics & Gynecology

## 2013-11-25 ENCOUNTER — Other Ambulatory Visit: Payer: Self-pay | Admitting: Obstetrics & Gynecology

## 2013-11-25 DIAGNOSIS — Z3490 Encounter for supervision of normal pregnancy, unspecified, unspecified trimester: Secondary | ICD-10-CM

## 2013-11-25 DIAGNOSIS — Z3689 Encounter for other specified antenatal screening: Secondary | ICD-10-CM | POA: Insufficient documentation

## 2013-11-29 ENCOUNTER — Ambulatory Visit (INDEPENDENT_AMBULATORY_CARE_PROVIDER_SITE_OTHER): Payer: Medicaid Other | Admitting: Obstetrics and Gynecology

## 2013-11-29 ENCOUNTER — Encounter: Payer: Self-pay | Admitting: Obstetrics and Gynecology

## 2013-11-29 VITALS — BP 111/69 | Wt 172.0 lb

## 2013-11-29 DIAGNOSIS — Z3492 Encounter for supervision of normal pregnancy, unspecified, second trimester: Secondary | ICD-10-CM

## 2013-11-29 DIAGNOSIS — Z3480 Encounter for supervision of other normal pregnancy, unspecified trimester: Secondary | ICD-10-CM

## 2013-11-29 NOTE — Progress Notes (Signed)
Patient doing well without complaints. Ultrasound results reviewed with patient.

## 2013-11-29 NOTE — Progress Notes (Signed)
P-68 

## 2013-12-27 ENCOUNTER — Ambulatory Visit (INDEPENDENT_AMBULATORY_CARE_PROVIDER_SITE_OTHER): Payer: Medicaid Other | Admitting: Family Medicine

## 2013-12-27 ENCOUNTER — Encounter: Payer: Self-pay | Admitting: Family Medicine

## 2013-12-27 VITALS — BP 112/70 | Wt 179.0 lb

## 2013-12-27 DIAGNOSIS — Z3490 Encounter for supervision of normal pregnancy, unspecified, unspecified trimester: Secondary | ICD-10-CM

## 2013-12-27 DIAGNOSIS — Z3480 Encounter for supervision of other normal pregnancy, unspecified trimester: Secondary | ICD-10-CM

## 2013-12-27 NOTE — Patient Instructions (Signed)
Second Trimester of Pregnancy The second trimester is from week 13 through week 28, months 4 through 6. The second trimester is often a time when you feel your best. Your body has also adjusted to being pregnant, and you begin to feel better physically. Usually, morning sickness has lessened or quit completely, you may have more energy, and you may have an increase in appetite. The second trimester is also a time when the fetus is growing rapidly. At the end of the sixth month, the fetus is about 9 inches long and weighs about 1 pounds. You will likely begin to feel the baby move (quickening) between 18 and 20 weeks of the pregnancy. BODY CHANGES Your body goes through many changes during pregnancy. The changes vary from woman to woman.   Your weight will continue to increase. You will notice your lower abdomen bulging out.  You may begin to get stretch marks on your hips, abdomen, and breasts.  You may develop headaches that can be relieved by medicines approved by your caregiver.  You may urinate more often because the fetus is pressing on your bladder.  You may develop or continue to have heartburn as a result of your pregnancy.  You may develop constipation because certain hormones are causing the muscles that push waste through your intestines to slow down.  You may develop hemorrhoids or swollen, bulging veins (varicose veins).  You may have back pain because of the weight gain and pregnancy hormones relaxing your joints between the bones in your pelvis and as a result of a shift in weight and the muscles that support your balance.  Your breasts will continue to grow and be tender.  Your gums may bleed and may be sensitive to brushing and flossing.  Dark spots or blotches (chloasma, mask of pregnancy) may develop on your face. This will likely fade after the baby is born.  A dark line from your belly button to the pubic area (linea nigra) may appear. This will likely fade after  the baby is born. WHAT TO EXPECT AT YOUR PRENATAL VISITS During a routine prenatal visit:  You will be weighed to make sure you and the fetus are growing normally.  Your blood pressure will be taken.  Your abdomen will be measured to track your baby's growth.  The fetal heartbeat will be listened to.  Any test results from the previous visit will be discussed. Your caregiver may ask you:  How you are feeling.  If you are feeling the baby move.  If you have had any abnormal symptoms, such as leaking fluid, bleeding, severe headaches, or abdominal cramping.  If you have any questions. Other tests that may be performed during your second trimester include:  Blood tests that check for:  Low iron levels (anemia).  Gestational diabetes (between 24 and 28 weeks).  Rh antibodies.  Urine tests to check for infections, diabetes, or protein in the urine.  An ultrasound to confirm the proper growth and development of the baby.  An amniocentesis to check for possible genetic problems.  Fetal screens for spina bifida and Down syndrome. HOME CARE INSTRUCTIONS   Avoid all smoking, herbs, alcohol, and unprescribed drugs. These chemicals affect the formation and growth of the baby.  Follow your caregiver's instructions regarding medicine use. There are medicines that are either safe or unsafe to take during pregnancy.  Exercise only as directed by your caregiver. Experiencing uterine cramps is a good sign to stop exercising.  Continue to eat regular,   healthy meals.  Wear a good support bra for breast tenderness.  Do not use hot tubs, steam rooms, or saunas.  Wear your seat belt at all times when driving.  Avoid raw meat, uncooked cheese, cat litter boxes, and soil used by cats. These carry germs that can cause birth defects in the baby.  Take your prenatal vitamins.  Try taking a stool softener (if your caregiver approves) if you develop constipation. Eat more high-fiber  foods, such as fresh vegetables or fruit and whole grains. Drink plenty of fluids to keep your urine clear or pale yellow.  Take warm sitz baths to soothe any pain or discomfort caused by hemorrhoids. Use hemorrhoid cream if your caregiver approves.  If you develop varicose veins, wear support hose. Elevate your feet for 15 minutes, 3 4 times a day. Limit salt in your diet.  Avoid heavy lifting, wear low heel shoes, and practice good posture.  Rest with your legs elevated if you have leg cramps or low back pain.  Visit your dentist if you have not gone yet during your pregnancy. Use a soft toothbrush to brush your teeth and be gentle when you floss.  A sexual relationship may be continued unless your caregiver directs you otherwise.  Continue to go to all your prenatal visits as directed by your caregiver. SEEK MEDICAL CARE IF:   You have dizziness.  You have mild pelvic cramps, pelvic pressure, or nagging pain in the abdominal area.  You have persistent nausea, vomiting, or diarrhea.  You have a bad smelling vaginal discharge.  You have pain with urination. SEEK IMMEDIATE MEDICAL CARE IF:   You have a fever.  You are leaking fluid from your vagina.  You have spotting or bleeding from your vagina.  You have severe abdominal cramping or pain.  You have rapid weight gain or loss.  You have shortness of breath with chest pain.  You notice sudden or extreme swelling of your face, hands, ankles, feet, or legs.  You have not felt your baby move in over an hour.  You have severe headaches that do not go away with medicine.  You have vision changes. Document Released: 11/01/2001 Document Revised: 07/10/2013 Document Reviewed: 01/08/2013 [employer] Patient Information 2014 [employer].  Breastfeeding Deciding to breastfeed is one of the best choices you can make for you and your baby. A change in hormones during pregnancy causes your breast tissue to grow and increases the  number and size of your milk ducts. These hormones also allow proteins, sugars, and fats from your blood supply to make breast milk in your milk-producing glands. Hormones prevent breast milk from being released before your baby is born as well as prompt milk flow after birth. Once breastfeeding has begun, thoughts of your baby, as well as his or her sucking or crying, can stimulate the release of milk from your milk-producing glands.  BENEFITS OF BREASTFEEDING For Your Baby  Your first milk (colostrum) helps your baby's digestive system function better.   There are antibodies in your milk that help your baby fight off infections.   Your baby has a lower incidence of asthma, allergies, and sudden infant death syndrome.   The nutrients in breast milk are better for your baby than infant formulas and are designed uniquely for your baby's needs.   Breast milk improves your baby's brain development.   Your baby is less likely to develop other conditions, such as childhood obesity, asthma, or type 2 diabetes mellitus.  For   You   Breastfeeding helps to create a very special bond between you and your baby.   Breastfeeding is convenient. Breast milk is always available at the correct temperature and costs nothing.   Breastfeeding helps to burn calories and helps you lose the weight gained during pregnancy.   Breastfeeding makes your uterus contract to its prepregnancy size faster and slows bleeding (lochia) after you give birth.   Breastfeeding helps to lower your risk of developing type 2 diabetes mellitus, osteoporosis, and breast or ovarian cancer later in life. SIGNS THAT YOUR BABY IS HUNGRY Early Signs of Hunger  Increased alertness or activity.  Stretching.  Movement of the head from side to side.  Movement of the head and opening of the mouth when the corner of the mouth or cheek is stroked (rooting).  Increased sucking sounds, smacking lips, cooing, sighing, or  squeaking.  Hand-to-mouth movements.  Increased sucking of fingers or hands. Late Signs of Hunger  Fussing.  Intermittent crying. Extreme Signs of Hunger Signs of extreme hunger will require calming and consoling before your baby will be able to breastfeed successfully. Do not wait for the following signs of extreme hunger to occur before you initiate breastfeeding:   Restlessness.  A loud, strong cry.   Screaming. BREASTFEEDING BASICS Breastfeeding Initiation  Find a comfortable place to sit or lie down, with your neck and back well supported.  Place a pillow or rolled up blanket under your baby to bring him or her to the level of your breast (if you are seated). Nursing pillows are specially designed to help support your arms and your baby while you breastfeed.  Make sure that your baby's abdomen is facing your abdomen.   Gently massage your breast. With your fingertips, massage from your chest wall toward your nipple in a circular motion. This encourages milk flow. You may need to continue this action during the feeding if your milk flows slowly.  Support your breast with 4 fingers underneath and your thumb above your nipple. Make sure your fingers are well away from your nipple and your baby's mouth.   Stroke your baby's lips gently with your finger or nipple.   When your baby's mouth is open wide enough, quickly bring your baby to your breast, placing your entire nipple and as much of the colored area around your nipple (areola) as possible into your baby's mouth.   More areola should be visible above your baby's upper lip than below the lower lip.   Your baby's tongue should be between his or her lower gum and your breast.   Ensure that your baby's mouth is correctly positioned around your nipple (latched). Your baby's lips should create a seal on your breast and be turned out (everted).  It is common for your baby to suck about 2 3 minutes in order to start the  flow of breast milk. Latching Teaching your baby how to latch on to your breast properly is very important. An improper latch can cause nipple pain and decreased milk supply for you and poor weight gain in your baby. Also, if your baby is not latched onto your nipple properly, he or she may swallow some air during feeding. This can make your baby fussy. Burping your baby when you switch breasts during the feeding can help to get rid of the air. However, teaching your baby to latch on properly is still the best way to prevent fussiness from swallowing air while breastfeeding. Signs that your baby has   successfully latched on to your nipple:    Silent tugging or silent sucking, without causing you pain.   Swallowing heard between every 3 4 sucks.    Muscle movement above and in front of his or her ears while sucking.  Signs that your baby has not successfully latched on to nipple:   Sucking sounds or smacking sounds from your baby while breastfeeding.  Nipple pain. If you think your baby has not latched on correctly, slip your finger into the corner of your baby's mouth to break the suction and place it between your baby's gums. Attempt breastfeeding initiation again. Signs of Successful Breastfeeding Signs from your baby:   A gradual decrease in the number of sucks or complete cessation of sucking.   Falling asleep.   Relaxation of his or her body.   Retention of a small amount of milk in his or her mouth.   Letting go of your breast by himself or herself. Signs from you:  Breasts that have increased in firmness, weight, and size 1 3 hours after feeding.   Breasts that are softer immediately after breastfeeding.  Increased milk volume, as well as a change in milk consistency and color by the 5th day of breastfeeding.   Nipples that are not sore, cracked, or bleeding. Signs That Your Baby is Getting Enough Milk  Wetting at least 3 diapers in a 24-hour period. The urine  should be clear and pale yellow by age 5 days.  At least 3 stools in a 24-hour period by age 5 days. The stool should be soft and yellow.  At least 3 stools in a 24-hour period by age 7 days. The stool should be seedy and yellow.  No loss of weight greater than 10% of birth weight during the first 3 days of age.  Average weight gain of 4 7 ounces (120 210 mL) per week after age 4 days.  Consistent daily weight gain by age 5 days, without weight loss after the age of 2 weeks. After a feeding, your baby may spit up a small amount. This is common. BREASTFEEDING FREQUENCY AND DURATION Frequent feeding will help you make more milk and can prevent sore nipples and breast engorgement. Breastfeed when you feel the need to reduce the fullness of your breasts or when your baby shows signs of hunger. This is called "breastfeeding on demand." Avoid introducing a pacifier to your baby while you are working to establish breastfeeding (the first 4 6 weeks after your baby is born). After this time you may choose to use a pacifier. Research has shown that pacifier use during the first year of a baby's life decreases the risk of sudden infant death syndrome (SIDS). Allow your baby to feed on each breast as long as he or she wants. Breastfeed until your baby is finished feeding. When your baby unlatches or falls asleep while feeding from the first breast, offer the second breast. Because newborns are often sleepy in the first few weeks of life, you may need to awaken your baby to get him or her to feed. Breastfeeding times will vary from baby to baby. However, the following rules can serve as a guide to help you ensure that your baby is properly fed:  Newborns (babies 4 weeks of age or younger) may breastfeed every 1 3 hours.  Newborns should not go longer than 3 hours during the day or 5 hours during the night without breastfeeding.  You should breastfeed your baby a minimum of   8 times in a 24-hour period until  you begin to introduce solid foods to your baby at around 6 months of age. BREAST MILK PUMPING Pumping and storing breast milk allows you to ensure that your baby is exclusively fed your breast milk, even at times when you are unable to breastfeed. This is especially important if you are going back to work while you are still breastfeeding or when you are not able to be present during feedings. Your lactation consultant can give you guidelines on how long it is safe to store breast milk.  A breast pump is a machine that allows you to pump milk from your breast into a sterile bottle. The pumped breast milk can then be stored in a refrigerator or freezer. Some breast pumps are operated by hand, while others use electricity. Ask your lactation consultant which type will work best for you. Breast pumps can be purchased, but some hospitals and breastfeeding support groups lease breast pumps on a monthly basis. A lactation consultant can teach you how to hand express breast milk, if you prefer not to use a pump.  CARING FOR YOUR BREASTS WHILE YOU BREASTFEED Nipples can become dry, cracked, and sore while breastfeeding. The following recommendations can help keep your breasts moisturized and healthy:  Avoid using soap on your nipples.   Wear a supportive bra. Although not required, special nursing bras and tank tops are designed to allow access to your breasts for breastfeeding without taking off your entire bra or top. Avoid wearing underwire style bras or extremely tight bras.  Air dry your nipples for 3 4minutes after each feeding.   Use only cotton bra pads to absorb leaked breast milk. Leaking of breast milk between feedings is normal.   Use lanolin on your nipples after breastfeeding. Lanolin helps to maintain your skin's normal moisture barrier. If you use pure lanolin you do not need to wash it off before feeding your baby again. Pure lanolin is not toxic to your baby. You may also hand express a  few drops of breast milk and gently massage that milk into your nipples and allow the milk to air dry. In the first few weeks after giving birth, some women experience extremely full breasts (engorgement). Engorgement can make your breasts feel heavy, warm, and tender to the touch. Engorgement peaks within 3 5 days after you give birth. The following recommendations can help ease engorgement:  Completely empty your breasts while breastfeeding or pumping. You may want to start by applying warm, moist heat (in the shower or with warm water-soaked hand towels) just before feeding or pumping. This increases circulation and helps the milk flow. If your baby does not completely empty your breasts while breastfeeding, pump any extra milk after he or she is finished.  Wear a snug bra (nursing or regular) or tank top for 1 2 days to signal your body to slightly decrease milk production.  Apply ice packs to your breasts, unless this is too uncomfortable for you.  Make sure that your baby is latched on and positioned properly while breastfeeding. If engorgement persists after 48 hours of following these recommendations, contact your health care provider or a lactation consultant. OVERALL HEALTH CARE RECOMMENDATIONS WHILE BREASTFEEDING  Eat healthy foods. Alternate between meals and snacks, eating 3 of each per day. Because what you eat affects your breast milk, some of the foods may make your baby more irritable than usual. Avoid eating these foods if you are sure that they are   negatively affecting your baby.  Drink milk, fruit juice, and water to satisfy your thirst (about 10 glasses a day).   Rest often, relax, and continue to take your prenatal vitamins to prevent fatigue, stress, and anemia.  Continue breast self-awareness checks.  Avoid chewing and smoking tobacco.  Avoid alcohol and drug use. Some medicines that may be harmful to your baby can pass through breast milk. It is important to ask your  health care provider before taking any medicine, including all over-the-counter and prescription medicine as well as vitamin and herbal supplements. It is possible to become pregnant while breastfeeding. If birth control is desired, ask your health care provider about options that will be safe for your baby. SEEK MEDICAL CARE IF:   You feel like you want to stop breastfeeding or have become frustrated with breastfeeding.  You have painful breasts or nipples.  Your nipples are cracked or bleeding.  Your breasts are red, tender, or warm.  You have a swollen area on either breast.  You have a fever or chills.  You have nausea or vomiting.  You have drainage other than breast milk from your nipples.  Your breasts do not become full before feedings by the 5th day after you give birth.  You feel sad and depressed.  Your baby is too sleepy to eat well.  Your baby is having trouble sleeping.   Your baby is wetting less than 3 diapers in a 24-hour period.  Your baby has less than 3 stools in a 24-hour period.  Your baby's skin or the white part of his or her eyes becomes yellow.   Your baby is not gaining weight by 5 days of age. SEEK IMMEDIATE MEDICAL CARE IF:   Your baby is overly tired (lethargic) and does not want to wake up and feed.  Your baby develops an unexplained fever. Document Released: 11/07/2005 Document Revised: 07/10/2013 Document Reviewed: 05/01/2013 [employer] Patient Information 2014 [employer].  

## 2013-12-27 NOTE — Progress Notes (Signed)
Doing well--No complaints FM present.

## 2013-12-27 NOTE — Progress Notes (Signed)
P-70 

## 2014-01-10 ENCOUNTER — Ambulatory Visit (INDEPENDENT_AMBULATORY_CARE_PROVIDER_SITE_OTHER): Payer: Medicaid Other | Admitting: Obstetrics & Gynecology

## 2014-01-10 ENCOUNTER — Encounter: Payer: Self-pay | Admitting: Obstetrics & Gynecology

## 2014-01-10 VITALS — BP 120/87 | Wt 182.0 lb

## 2014-01-10 DIAGNOSIS — Z23 Encounter for immunization: Secondary | ICD-10-CM

## 2014-01-10 DIAGNOSIS — Z3480 Encounter for supervision of other normal pregnancy, unspecified trimester: Secondary | ICD-10-CM

## 2014-01-10 DIAGNOSIS — Z3490 Encounter for supervision of normal pregnancy, unspecified, unspecified trimester: Secondary | ICD-10-CM

## 2014-01-10 LAB — CBC
HCT: 36.4 % (ref 36.0–46.0)
HEMOGLOBIN: 12.8 g/dL (ref 12.0–15.0)
MCH: 33 pg (ref 26.0–34.0)
MCHC: 35.2 g/dL (ref 30.0–36.0)
MCV: 93.8 fL (ref 78.0–100.0)
Platelets: 261 10*3/uL (ref 150–400)
RBC: 3.88 MIL/uL (ref 3.87–5.11)
RDW: 12.5 % (ref 11.5–15.5)
WBC: 10.7 10*3/uL — AB (ref 4.0–10.5)

## 2014-01-10 NOTE — Progress Notes (Signed)
P = 87 

## 2014-01-10 NOTE — Progress Notes (Signed)
Routine visit. Good FM. No problems. Glucola, TDAP, and labs today.

## 2014-01-11 LAB — HIV ANTIBODY (ROUTINE TESTING W REFLEX): HIV: NONREACTIVE

## 2014-01-11 LAB — RPR

## 2014-01-11 LAB — GLUCOSE TOLERANCE, 1 HOUR (50G) W/O FASTING: Glucose, 1 Hour GTT: 155 mg/dL — ABNORMAL HIGH (ref 70–140)

## 2014-01-23 ENCOUNTER — Ambulatory Visit (INDEPENDENT_AMBULATORY_CARE_PROVIDER_SITE_OTHER): Payer: Medicaid Other | Admitting: Obstetrics & Gynecology

## 2014-01-23 VITALS — BP 117/70 | Wt 184.6 lb

## 2014-01-23 DIAGNOSIS — Z3480 Encounter for supervision of other normal pregnancy, unspecified trimester: Secondary | ICD-10-CM

## 2014-01-23 DIAGNOSIS — Z3490 Encounter for supervision of normal pregnancy, unspecified, unspecified trimester: Secondary | ICD-10-CM

## 2014-01-23 DIAGNOSIS — O99810 Abnormal glucose complicating pregnancy: Secondary | ICD-10-CM

## 2014-01-23 NOTE — Progress Notes (Signed)
P-73 

## 2014-01-23 NOTE — Progress Notes (Signed)
Abnormal 1 hr GTT of 155, will be scheduled for 3 hr GTT.  Patient informed, also discussed risk of macrosomia even if 3 hr GTT is normal.  No other complaints or concerns.  Fetal movement and labor precautions reviewed.

## 2014-01-23 NOTE — Patient Instructions (Signed)
Return to clinic for any obstetric concerns or go to [facility] for evaluation  

## 2014-02-06 ENCOUNTER — Ambulatory Visit (INDEPENDENT_AMBULATORY_CARE_PROVIDER_SITE_OTHER): Payer: Medicaid Other | Admitting: Obstetrics & Gynecology

## 2014-02-06 ENCOUNTER — Encounter: Payer: Self-pay | Admitting: Obstetrics & Gynecology

## 2014-02-06 DIAGNOSIS — Z3490 Encounter for supervision of normal pregnancy, unspecified, unspecified trimester: Secondary | ICD-10-CM

## 2014-02-06 DIAGNOSIS — R7309 Other abnormal glucose: Secondary | ICD-10-CM

## 2014-02-06 DIAGNOSIS — O99810 Abnormal glucose complicating pregnancy: Secondary | ICD-10-CM

## 2014-02-06 DIAGNOSIS — Z3480 Encounter for supervision of other normal pregnancy, unspecified trimester: Secondary | ICD-10-CM

## 2014-02-06 NOTE — Progress Notes (Signed)
Routine visit. Good FM. No problems. 3 hour glucola today.

## 2014-02-07 LAB — GLUCOSE TOLERANCE, 3 HOURS
GLUCOSE 3 HOUR GTT: 106 mg/dL (ref 70–144)
GLUCOSE, 1 HOUR-GESTATIONAL: 133 mg/dL (ref 70–189)
GLUCOSE, FASTING-GESTATIONAL: 77 mg/dL (ref 70–104)
Glucose Tolerance, 2 hour: 123 mg/dL (ref 70–164)

## 2014-02-10 ENCOUNTER — Encounter: Payer: Self-pay | Admitting: Obstetrics & Gynecology

## 2014-02-20 ENCOUNTER — Ambulatory Visit (INDEPENDENT_AMBULATORY_CARE_PROVIDER_SITE_OTHER): Payer: Medicaid Other | Admitting: Obstetrics & Gynecology

## 2014-02-20 ENCOUNTER — Encounter: Payer: Self-pay | Admitting: Obstetrics & Gynecology

## 2014-02-20 VITALS — BP 118/80 | Wt 192.0 lb

## 2014-02-20 DIAGNOSIS — Z3480 Encounter for supervision of other normal pregnancy, unspecified trimester: Secondary | ICD-10-CM

## 2014-02-20 DIAGNOSIS — Z3490 Encounter for supervision of normal pregnancy, unspecified, unspecified trimester: Secondary | ICD-10-CM

## 2014-02-20 NOTE — Progress Notes (Signed)
P-75 

## 2014-02-20 NOTE — Progress Notes (Signed)
Routine visit. Good FM. No problems. Discussed risks of excess weight gain with pregnancy.

## 2014-03-06 ENCOUNTER — Encounter: Payer: Self-pay | Admitting: Obstetrics & Gynecology

## 2014-03-06 ENCOUNTER — Ambulatory Visit (INDEPENDENT_AMBULATORY_CARE_PROVIDER_SITE_OTHER): Payer: Medicaid Other | Admitting: Obstetrics & Gynecology

## 2014-03-06 VITALS — BP 120/84 | Wt 195.8 lb

## 2014-03-06 DIAGNOSIS — Z3480 Encounter for supervision of other normal pregnancy, unspecified trimester: Secondary | ICD-10-CM

## 2014-03-06 DIAGNOSIS — Z3490 Encounter for supervision of normal pregnancy, unspecified, unspecified trimester: Secondary | ICD-10-CM

## 2014-03-06 NOTE — Progress Notes (Signed)
P-78 

## 2014-03-06 NOTE — Progress Notes (Signed)
Routine visit. Good FM. No problems. Cultures at next visit 

## 2014-03-12 ENCOUNTER — Ambulatory Visit (INDEPENDENT_AMBULATORY_CARE_PROVIDER_SITE_OTHER): Payer: Medicaid Other | Admitting: Obstetrics & Gynecology

## 2014-03-12 VITALS — BP 128/78 | HR 77 | Wt 197.0 lb

## 2014-03-12 DIAGNOSIS — Z3490 Encounter for supervision of normal pregnancy, unspecified, unspecified trimester: Secondary | ICD-10-CM

## 2014-03-12 DIAGNOSIS — Z3480 Encounter for supervision of other normal pregnancy, unspecified trimester: Secondary | ICD-10-CM

## 2014-03-12 LAB — OB RESULTS CONSOLE GBS: GBS: NEGATIVE

## 2014-03-12 NOTE — Progress Notes (Signed)
Pelvic cultures done today.  No other complaints or concerns.  Fetal movement and labor precautions reviewed. 

## 2014-03-12 NOTE — Patient Instructions (Signed)
Return to clinic for any obstetric concerns or go to [facility] for evaluation  

## 2014-03-13 LAB — GC/CHLAMYDIA PROBE AMP
CT PROBE, AMP APTIMA: NEGATIVE
GC Probe RNA: NEGATIVE

## 2014-03-15 ENCOUNTER — Encounter: Payer: Self-pay | Admitting: Obstetrics & Gynecology

## 2014-03-15 LAB — CULTURE, STREPTOCOCCUS GRP B W/SUSCEPT

## 2014-03-20 ENCOUNTER — Ambulatory Visit (INDEPENDENT_AMBULATORY_CARE_PROVIDER_SITE_OTHER): Payer: Medicaid Other | Admitting: Obstetrics & Gynecology

## 2014-03-20 ENCOUNTER — Inpatient Hospital Stay (HOSPITAL_COMMUNITY)
Admission: AD | Admit: 2014-03-20 | Discharge: 2014-03-22 | DRG: 775 | Disposition: A | Discharge status: Home / Self Care | Payer: Medicaid Other | Source: Ambulatory Visit | Attending: Obstetrics & Gynecology | Admitting: Obstetrics & Gynecology

## 2014-03-20 ENCOUNTER — Encounter (HOSPITAL_COMMUNITY): Payer: Self-pay

## 2014-03-20 VITALS — BP 125/91 | HR 76 | Wt 199.0 lb

## 2014-03-20 DIAGNOSIS — Z823 Family history of stroke: Secondary | ICD-10-CM

## 2014-03-20 DIAGNOSIS — O139 Gestational [pregnancy-induced] hypertension without significant proteinuria, unspecified trimester: Principal | ICD-10-CM | POA: Diagnosis present

## 2014-03-20 DIAGNOSIS — Z8049 Family history of malignant neoplasm of other genital organs: Secondary | ICD-10-CM

## 2014-03-20 DIAGNOSIS — Z3480 Encounter for supervision of other normal pregnancy, unspecified trimester: Secondary | ICD-10-CM

## 2014-03-20 DIAGNOSIS — Z8041 Family history of malignant neoplasm of ovary: Secondary | ICD-10-CM

## 2014-03-20 DIAGNOSIS — Z3490 Encounter for supervision of normal pregnancy, unspecified, unspecified trimester: Secondary | ICD-10-CM

## 2014-03-20 DIAGNOSIS — Z8249 Family history of ischemic heart disease and other diseases of the circulatory system: Secondary | ICD-10-CM

## 2014-03-20 DIAGNOSIS — Z87891 Personal history of nicotine dependence: Secondary | ICD-10-CM

## 2014-03-20 DIAGNOSIS — O163 Unspecified maternal hypertension, third trimester: Secondary | ICD-10-CM | POA: Insufficient documentation

## 2014-03-20 LAB — COMPREHENSIVE METABOLIC PANEL
ALT: 13 U/L (ref 0–35)
AST: 33 U/L (ref 0–37)
Albumin: 2.8 g/dL — ABNORMAL LOW (ref 3.5–5.2)
Alkaline Phosphatase: 160 U/L — ABNORMAL HIGH (ref 39–117)
BUN: 7 mg/dL (ref 6–23)
CALCIUM: 9 mg/dL (ref 8.4–10.5)
CO2: 18 meq/L — AB (ref 19–32)
CREATININE: 0.49 mg/dL — AB (ref 0.50–1.10)
Chloride: 98 mEq/L (ref 96–112)
GFR calc non Af Amer: >90 mL/min (ref >90)
Glucose, Bld: 77 mg/dL (ref 70–99)
Potassium: 5.1 mEq/L (ref 3.7–5.3)
Sodium: 134 mEq/L — ABNORMAL LOW (ref 137–147)
Total Bilirubin: 0.5 mg/dL (ref 0.3–1.2)
Total Protein: 6.8 g/dL (ref 6.0–8.3)

## 2014-03-20 LAB — URINALYSIS, ROUTINE W REFLEX MICROSCOPIC
Bilirubin Urine: NEGATIVE
Glucose, UA: NEGATIVE mg/dL
Hgb urine dipstick: NEGATIVE
KETONES UR: NEGATIVE mg/dL
NITRITE: NEGATIVE
Protein, ur: NEGATIVE mg/dL
Specific Gravity, Urine: 1.015 (ref 1.005–1.030)
UROBILINOGEN UA: 0.2 mg/dL (ref 0.0–1.0)
pH: 6 (ref 5.0–8.0)

## 2014-03-20 LAB — CBC
HEMATOCRIT: 36.6 % (ref 36.0–46.0)
HEMATOCRIT: 35.6 % — AB (ref 36.0–46.0)
HEMOGLOBIN: 12.6 g/dL (ref 12.0–15.0)
Hemoglobin: 12.7 g/dL (ref 12.0–15.0)
MCH: 31.4 pg (ref 26.0–34.0)
MCH: 32 pg (ref 26.0–34.0)
MCHC: 34.7 g/dL (ref 30.0–36.0)
MCHC: 35.4 g/dL (ref 30.0–36.0)
MCV: 90.6 fL (ref 78.0–100.0)
MCV: 90.4 fL (ref 78.0–100.0)
Platelets: 268 10*3/uL (ref 150–400)
Platelets: 239 10*3/uL (ref 150–400)
RBC: 4.04 MIL/uL (ref 3.87–5.11)
RBC: 3.94 MIL/uL (ref 3.87–5.11)
RDW: 12.8 % (ref 11.5–15.5)
RDW: 12.3 % (ref 11.5–15.5)
WBC: 10.6 10*3/uL — AB (ref 4.0–10.5)
WBC: 12.4 10*3/uL — AB (ref 4.0–10.5)

## 2014-03-20 LAB — URINE MICROSCOPIC-ADD ON

## 2014-03-20 LAB — PROTEIN / CREATININE RATIO, URINE
Creatinine, Urine: 78.34 mg/dL
PROTEIN CREATININE RATIO: 0.15 (ref 0.00–0.15)
Total Protein, Urine: 11.9 mg/dL

## 2014-03-20 MED ORDER — LACTATED RINGERS IV SOLN: 500.0000 mL | INTRAVENOUS | Status: DC | PRN

## 2014-03-20 MED ORDER — LACTATED RINGERS IV SOLN
INTRAVENOUS | Status: DC
Start: 2014-03-20 — End: 2014-03-21
  Administered 2014-03-20 – 2014-03-21 (×3): via INTRAVENOUS

## 2014-03-20 MED ORDER — ACETAMINOPHEN 325 MG PO TABS: 650.0000 mg | ORAL_TABLET | ORAL | Status: DC | PRN

## 2014-03-20 MED ORDER — IBUPROFEN 600 MG PO TABS: 600.0000 mg | ORAL_TABLET | Freq: Four times a day (QID) | ORAL | Status: DC | PRN

## 2014-03-20 MED ORDER — FENTANYL CITRATE 0.05 MG/ML IJ SOLN: 50.0000 ug | INTRAMUSCULAR | Status: DC | PRN

## 2014-03-20 MED ORDER — CITRIC ACID-SODIUM CITRATE 334-500 MG/5ML PO SOLN: 30.0000 mL | ORAL | Status: DC | PRN

## 2014-03-20 MED ORDER — BUTALBITAL-APAP-CAFFEINE 50-325-40 MG PO TABS: 2.0000 | ORAL_TABLET | Freq: Once | ORAL | Status: DC

## 2014-03-20 MED ORDER — LIDOCAINE HCL (PF) 1 % IJ SOLN
30.0000 mL | INTRAMUSCULAR | Status: DC | PRN
  Filled 2014-03-20: qty 30

## 2014-03-20 MED ORDER — FLEET ENEMA 7-19 GM/118ML RE ENEM: 1.0000 | ENEMA | RECTAL | Status: DC | PRN

## 2014-03-20 MED ORDER — TERBUTALINE SULFATE 1 MG/ML IJ SOLN: 0.2500 mg | Freq: Once | INTRAMUSCULAR | Status: AC | PRN

## 2014-03-20 MED ORDER — ONDANSETRON HCL 4 MG/2ML IJ SOLN: 4.0000 mg | Freq: Four times a day (QID) | INTRAMUSCULAR | Status: DC | PRN

## 2014-03-20 MED ORDER — OXYTOCIN 40 UNITS IN LACTATED RINGERS INFUSION - SIMPLE MED
62.5000 mL/h | INTRAVENOUS | Status: DC
  Filled 2014-03-20: qty 1000

## 2014-03-20 MED ORDER — MISOPROSTOL 200 MCG PO TABS
50.0000 ug | ORAL_TABLET | ORAL | Status: DC
  Administered 2014-03-21 (×2): 50 ug via ORAL
  Filled 2014-03-20 (×4): qty 1

## 2014-03-20 MED ORDER — ZOLPIDEM TARTRATE 5 MG PO TABS: 5.0000 mg | ORAL_TABLET | Freq: Every evening | ORAL | Status: DC | PRN

## 2014-03-20 MED ORDER — OXYCODONE-ACETAMINOPHEN 5-325 MG PO TABS: 1.0000 | ORAL_TABLET | ORAL | Status: DC | PRN

## 2014-03-20 MED ORDER — OXYTOCIN BOLUS FROM INFUSION: 500.0000 mL | INTRAVENOUS | Status: DC

## 2014-03-20 NOTE — Progress Notes (Signed)
   Amber Wilkins is a 23 y.o. G1P0 at 7650w3d  [facility]  Subjective: HA completely resolved with fioricet  Objective: BP 138/83  Pulse 97  Temp(Src) 98.4 F (36.9 C) (Oral)  Resp 18  LMP 06/22/2013    FHT:  FHR: 140 bpm, variability: moderate,  accelerations:  Present,  decelerations:  Present occ mild variable UC:   none SVE:   Dilation: 1.5 Effacement (%): Thick Station: Ballotable Exam by:: Amber Wilkins, CNM  Accidental AROM with foley bulb placement.    Labs: Lab Results  Component Value Date   WBC 12.4* 03/20/2014   HGB 12.6 03/20/2014   HCT 35.6* 03/20/2014   MCV 90.4 03/20/2014   PLT 239 03/20/2014    Assessment / Plan: IOL for GHTN;  will add oral cytotec to FB d/t AROm  Labor: ripening phase Fetal Wellbeing:  Category I Pain Control:  Labor support without medications Anticipated MOD:  NSVD  Amber Wilkins 03/20/2014, 11:04 PM

## 2014-03-20 NOTE — [facility] Note (Signed)
Pt c/o constant ha. Denies any vision changes.

## 2014-03-20 NOTE — H&P (Signed)
Amber Wilkins is a 23 y.o. female G1P0 with IUP at 7470w3d presenting for IOL for GHTN.She was seen earlier today for a routine visit and noted to have b/p of 125/91.  She developed a HA this evening and upper abdominal pain bilaterally. She has not taken anything for her HA. Labs that were drawn in the office are still pending, so we will repeat bloodwork/urine.  [facility] 7 [facility]  Prenatal History/Complications: None  Past Medical History: Past Medical History  Diagnosis Date  . Seasonal allergies   . Bronchitis, allergic     Past Surgical History: Past Surgical History  Procedure Laterality Date  . Appendectomy  2002    Obstetrical History: OB History   Grav Para Term Preterm Abortions TAB SAB Ect Mult Living   1                Social History: History   Social History  . Marital Status: Single    Spouse Name: N/A    Number of Children: N/A  . Years of Education: N/A   Social History Main Topics  . Smoking status: Former Smoker -- 0.25 packs/day for 1 years    Types: Cigarettes    Quit date: 07/12/2013  . Smokeless tobacco: None  . Alcohol Use: No  . Drug Use: Yes    Special: Marijuana     Comment: august 2014  . Sexual Activity: Yes   Other Topics Concern  . None   Social History Narrative   Lives with mom and dad.    Student at [employer].    Family History: Family History  Problem Relation Age of Onset  . Seizures Mother   . Ovarian cancer Mother   . Cervical cancer Mother   . Lupus Mother   . Kidney disease Mother   . Hypertension Father   . Hypertension Paternal Grandmother   . Stroke Paternal Grandfather   . Hypertension Paternal Grandfather   . Heart disease Maternal Grandmother     Allergies: Allergies  Allergen Reactions  . Penicillins Hives and Nausea And Vomiting    Prescriptions prior to admission  Medication Sig Dispense Refill  . Pediatric Multivit-Minerals-C (KIDS GUMMY BEAR VITAMINS) CHEW Chew by mouth.          Prenatal Transfer Tool  Maternal Diabetes: No Genetic Screening: Normal Maternal Ultrasounds/Referrals: Normal Fetal Ultrasounds or other Referrals:  None Maternal Substance Abuse:  No Significant Maternal Medications:  None Significant Maternal Lab Results: None     Review of Systems   Constitutional: Negative for fever, chills, weight loss, malaise/fatigue and diaphoresis.  HENT: Negative for hearing loss, ear pain, nosebleeds, congestion, sore throat, neck pain, tinnitus and ear discharge.   Eyes: Negative for blurred vision, double vision, photophobia, pain, discharge and redness.  Respiratory: Negative for cough, hemoptysis, sputum production, shortness of breath, wheezing and stridor.   Cardiovascular: Negative for chest pain, palpitations, orthopnea,  leg swelling  Gastrointestinal: Positive for abdominal pain. Negative for heartburn, nausea, vomiting, diarrhea, constipation, blood in stool Genitourinary: Negative for dysuria, urgency, frequency, hematuria and flank pain.  Musculoskeletal: Negative for myalgias, back pain, joint pain and falls.  Skin: Negative for itching and rash.  Neurological: Negative for dizziness, tingling, tremors, sensory change, speech change, focal weakness, seizures, loss of consciousness, weakness and headaches.  Endo/Heme/Allergies: Negative for environmental allergies and polydipsia. Does not bruise/bleed easily.  Psychiatric/Behavioral: Negative for depression, suicidal ideas, hallucinations, memory loss and substance abuse. The patient is not nervous/anxious and  does not have insomnia.       Blood pressure 142/100, pulse 95, last menstrual period 06/22/2013. General appearance: alert, cooperative and no distress Lungs: clear to auscultation bilaterally Heart: regular rate and rhythm Abdomen: soft, non-tender; bowel sounds normal Pelvic: 1-2/thick/ballottable Extremities: Homans sign is negative, no sign of DVT DTR's 3+, no  clonus Presentation: cephalic Fetal monitoringBaseline: 140 bpm, Variability: Good {> 6 bpm), Accelerations: Reactive and Decelerations: Absent Uterine activityNone     Prenatal labs: ABO, Rh: A/POS/-- (10/06 1157) Antibody: NEG (10/06 1157) Rubella:   RPR: NON REAC (02/20 1123)  HBsAg: NEGATIVE (10/06 1157)  HIV: NON REACTIVE (02/20 1123)  GBS:    1 hr Glucola 155, normal 3hour: 77/133/123/106 Genetic screening  normal Anatomy US normal   No results found for this or any previous visit (from the past 24 hour(s)).  Assessment: Amber Wilkins is a 23 y.o. G1P0 with an IUP at 5775w3d presenting for IOL for GHTN  Plan: Labs to R/O preeclampsia If HA is not resolved with medication, will start MgSO4   Jacklyn ShellFrances Cresenzo-Dishmon 03/20/2014, 9:53 PM

## 2014-03-20 NOTE — Progress Notes (Signed)
DBP 91mmHg. No current headaches, blurry vision, RUQ/epigastric pain. Recheck at end of visit 130/90.  Labs checked today, will follow up results.  Return tomorrow for BP check.  If still elevated tomorrow, [facility] and will need [facility].  This plan was discussed with patient and her boyfriend/FOB.  Preeclampsia, fetal movement and labor precautions reviewed.

## 2014-03-20 NOTE — Patient Instructions (Addendum)
Return to clinic for any obstetric concerns or go to [facility] for evaluation Hypertension During Pregnancy Hypertension is also called high blood pressure. It can occur at any time in life and during pregnancy. When you have hypertension, there is extra pressure inside your blood vessels that carry blood from the heart to the rest of your body (arteries). Hypertension during pregnancy can cause problems for you and your baby. Your baby might not weigh as much as it should at birth or might be born early (premature). Very bad cases of hypertension during pregnancy can be life threatening.  Different types of hypertension can occur during pregnancy.   Chronic hypertension. This happens when a woman has hypertension before pregnancy and it continues during pregnancy.  Gestational hypertension. This is when hypertension develops during pregnancy.  Preeclampsia or toxemia of pregnancy. This is a very serious type of hypertension that develops only during pregnancy. It is a disease that affects the whole body (systemic) and can be very dangerous for both mother and baby.  Gestational hypertension and preeclampsia usually go away after your baby is born. Blood pressure generally stabilizes within 6 weeks. Women who have hypertension during pregnancy have a greater chance of developing hypertension later in life or with future pregnancies. RISK FACTORS Some factors make you more likely to develop hypertension during pregnancy. Risk factors include:  Having hypertension before pregnancy.  Having hypertension during a previous pregnancy.  Being overweight.  Being older than 40.  Being pregnant with more than one baby (multiples).  Having diabetes or kidney problems. SIGNS AND SYMPTOMS Chronic and gestational hypertension rarely cause symptoms. Preeclampsia has symptoms, which may include:  Increased protein in your urine. Your health care provider will check for this at every prenatal  visit.  Swelling of your hands and face.  Rapid weight gain.  Headaches.  Visual changes.  Being bothered by light.  Abdominal pain, especially in the right upper area.  Chest pain.  Shortness of breath.  Increased reflexes.  Seizures. Seizures occur with a more severe form of preeclampsia, called eclampsia. DIAGNOSIS   You may be diagnosed with hypertension during a regular prenatal exam. At each visit, tests may include:  Blood pressure checks.  A urine test to check for protein in your urine.  The type of hypertension you are diagnosed with depends on when you developed it. It also depends on your specific blood pressure reading.  Developing hypertension before 20 weeks of pregnancy is consistent with chronic hypertension.  Developing hypertension after 20 weeks of pregnancy is consistent with gestational hypertension.  Hypertension with increased urinary protein is diagnosed as preeclampsia.  Blood pressure measurements that stay above 160 systolic or 110 diastolic are a sign of severe preeclampsia. TREATMENT Treatment for hypertension during pregnancy varies. Treatment depends on the type of hypertension and how serious it is.  If you take medicine for chronic hypertension, you may need to switch medicines.  Drugs called ACE inhibitors should not be taken during pregnancy.  Low-dose aspirin may be suggested for women who have risk factors for preeclampsia.  If you have gestational hypertension, you may need to take a blood pressure medicine that is safe during pregnancy. Your health care provider will recommend the appropriate medicine.  If you have severe preeclampsia, you may need to be in the hospital. Health care providers will watch you and the baby very closely. You also may need to take medicine (magnesium sulfate) to prevent seizures and lower blood pressure.  Sometimes an early delivery   is needed. This may be the case if the condition worsens. It would  be done to protect you and the baby. The only cure for preeclampsia is delivery. HOME CARE INSTRUCTIONS  Schedule and keep all of your regular appointments for prenatal care.  Only take over-the-counter or prescription medicines as directed by your health care provider. Tell your health care provider about all medicines you take.  Eat as little salt as possible.  Get regular exercise.  Do not drink alcohol.  Do not use tobacco products.  Do not drink products with caffeine.  Lie on your left side when resting. SEEK IMMEDIATE MEDICAL CARE IF:  You have severe abdominal pain.  You have sudden swelling in the hands, ankles, or face.  You gain 4 pounds (1.8 kg) or more in 1 week.  You vomit repeatedly.  You have vaginal bleeding.  You do not feel the baby moving as much.  You have a headache.  You have blurred or double vision.  You have muscle twitching or spasms.  You have shortness of breath.  You have blue fingernails and lips.  You have blood in your urine. MAKE SURE YOU:  Understand these instructions.  Will watch your condition.  Will get help right away if you are not doing well or get worse. Document Released: 07/26/2011 Document Revised: 08/28/2013 Document Reviewed: 06/06/2013 ExitCare Patient Information 2014 [employer].  

## 2014-03-21 ENCOUNTER — Encounter (HOSPITAL_COMMUNITY): Payer: Self-pay | Admitting: *Deleted

## 2014-03-21 ENCOUNTER — Encounter (HOSPITAL_COMMUNITY): Payer: Medicaid Other | Admitting: Anesthesiology

## 2014-03-21 ENCOUNTER — Inpatient Hospital Stay (HOSPITAL_COMMUNITY): Payer: Medicaid Other | Admitting: Anesthesiology

## 2014-03-21 ENCOUNTER — Other Ambulatory Visit: Payer: Medicaid Other

## 2014-03-21 DIAGNOSIS — Z8249 Family history of ischemic heart disease and other diseases of the circulatory system: Secondary | ICD-10-CM

## 2014-03-21 DIAGNOSIS — O139 Gestational [pregnancy-induced] hypertension without significant proteinuria, unspecified trimester: Secondary | ICD-10-CM

## 2014-03-21 LAB — TYPE AND SCREEN
ABO/RH(D): A POS
ANTIBODY SCREEN: NEGATIVE

## 2014-03-21 LAB — CBC
HEMATOCRIT: 34.3 % — AB (ref 36.0–46.0)
HEMOGLOBIN: 11.9 g/dL — AB (ref 12.0–15.0)
MCH: 31.4 pg (ref 26.0–34.0)
MCHC: 34.7 g/dL (ref 30.0–36.0)
MCV: 90.5 fL (ref 78.0–100.0)
Platelets: 226 10*3/uL (ref 150–400)
RBC: 3.79 MIL/uL — AB (ref 3.87–5.11)
RDW: 12.3 % (ref 11.5–15.5)
WBC: 15.3 10*3/uL — ABNORMAL HIGH (ref 4.0–10.5)

## 2014-03-21 LAB — PROTEIN / CREATININE RATIO, URINE
Creatinine, Urine: 88.3 mg/dL
PROTEIN CREATININE RATIO: 0.09 (ref <0.15)
Total Protein, Urine: 8 mg/dL

## 2014-03-21 LAB — COMPREHENSIVE METABOLIC PANEL
ALT: 11 U/L (ref 0–35)
AST: 14 U/L (ref 0–37)
Albumin: 3.4 g/dL — ABNORMAL LOW (ref 3.5–5.2)
Alkaline Phosphatase: 160 U/L — ABNORMAL HIGH (ref 39–117)
BUN: 5 mg/dL — AB (ref 6–23)
CALCIUM: 9.4 mg/dL (ref 8.4–10.5)
CHLORIDE: 103 meq/L (ref 96–112)
CO2: 21 meq/L (ref 19–32)
Creat: 0.48 mg/dL — ABNORMAL LOW (ref 0.50–1.10)
Glucose, Bld: 99 mg/dL (ref 70–99)
Potassium: 3.9 mEq/L (ref 3.5–5.3)
Sodium: 134 mEq/L — ABNORMAL LOW (ref 135–145)
Total Bilirubin: 0.6 mg/dL (ref 0.2–1.2)
Total Protein: 6.6 g/dL (ref 6.0–8.3)

## 2014-03-21 LAB — RPR

## 2014-03-21 LAB — ABO/RH: ABO/RH(D): A POS

## 2014-03-21 MED ORDER — DIPHENHYDRAMINE HCL 25 MG PO CAPS: 25.0000 mg | ORAL_CAPSULE | Freq: Four times a day (QID) | ORAL | Status: DC | PRN

## 2014-03-21 MED ORDER — OXYTOCIN 40 UNITS IN LACTATED RINGERS INFUSION - SIMPLE MED: 1.0000 m[IU]/min | INTRAVENOUS | Status: DC

## 2014-03-21 MED ORDER — SIMETHICONE 80 MG PO CHEW: 80.0000 mg | CHEWABLE_TABLET | ORAL | Status: DC | PRN

## 2014-03-21 MED ORDER — LIDOCAINE HCL (PF) 1 % IJ SOLN
INTRAMUSCULAR | Status: DC | PRN
  Administered 2014-03-21 (×4): 4 mL

## 2014-03-21 MED ORDER — ONDANSETRON HCL 4 MG PO TABS: 4.0000 mg | ORAL_TABLET | ORAL | Status: DC | PRN

## 2014-03-21 MED ORDER — WITCH HAZEL-GLYCERIN EX PADS: 1.0000 "application " | MEDICATED_PAD | CUTANEOUS | Status: DC | PRN

## 2014-03-21 MED ORDER — TETANUS-DIPHTH-ACELL PERTUSSIS 5-2.5-18.5 LF-MCG/0.5 IM SUSP: 0.5000 mL | Freq: Once | INTRAMUSCULAR | Status: DC

## 2014-03-21 MED ORDER — LACTATED RINGERS IV SOLN
500.0000 mL | Freq: Once | INTRAVENOUS | Status: AC
  Administered 2014-03-21: 500 mL via INTRAVENOUS

## 2014-03-21 MED ORDER — BENZOCAINE-MENTHOL 20-0.5 % EX AERO
1.0000 "application " | INHALATION_SPRAY | CUTANEOUS | Status: DC | PRN
  Administered 2014-03-21: 1 via TOPICAL
  Filled 2014-03-21: qty 56

## 2014-03-21 MED ORDER — FENTANYL 2.5 MCG/ML BUPIVACAINE 1/10 % EPIDURAL INFUSION (WH - ANES)
14.0000 mL/h | INTRAMUSCULAR | Status: DC | PRN
  Administered 2014-03-21: 14 mL/h via EPIDURAL
  Filled 2014-03-21: qty 125

## 2014-03-21 MED ORDER — DIBUCAINE 1 % RE OINT: 1.0000 "application " | TOPICAL_OINTMENT | RECTAL | Status: DC | PRN

## 2014-03-21 MED ORDER — SENNOSIDES-DOCUSATE SODIUM 8.6-50 MG PO TABS
2.0000 | ORAL_TABLET | ORAL | Status: DC
  Administered 2014-03-22: 2 via ORAL
  Filled 2014-03-21: qty 2

## 2014-03-21 MED ORDER — EPHEDRINE 5 MG/ML INJ
10.0000 mg | INTRAVENOUS | Status: DC | PRN
  Filled 2014-03-21: qty 2

## 2014-03-21 MED ORDER — ZOLPIDEM TARTRATE 5 MG PO TABS: 5.0000 mg | ORAL_TABLET | Freq: Every evening | ORAL | Status: DC | PRN

## 2014-03-21 MED ORDER — IBUPROFEN 600 MG PO TABS
600.0000 mg | ORAL_TABLET | Freq: Four times a day (QID) | ORAL | Status: DC
  Administered 2014-03-21 – 2014-03-22 (×4): 600 mg via ORAL
  Filled 2014-03-21 (×4): qty 1

## 2014-03-21 MED ORDER — LANOLIN HYDROUS EX OINT: TOPICAL_OINTMENT | CUTANEOUS | Status: DC | PRN

## 2014-03-21 MED ORDER — PHENYLEPHRINE 40 MCG/ML (10ML) SYRINGE FOR IV PUSH (FOR BLOOD PRESSURE SUPPORT)
80.0000 ug | PREFILLED_SYRINGE | INTRAVENOUS | Status: DC | PRN
  Filled 2014-03-21: qty 10
  Filled 2014-03-21: qty 2

## 2014-03-21 MED ORDER — DIPHENHYDRAMINE HCL 50 MG/ML IJ SOLN: 12.5000 mg | INTRAMUSCULAR | Status: DC | PRN

## 2014-03-21 MED ORDER — PHENYLEPHRINE 40 MCG/ML (10ML) SYRINGE FOR IV PUSH (FOR BLOOD PRESSURE SUPPORT)
80.0000 ug | PREFILLED_SYRINGE | INTRAVENOUS | Status: DC | PRN
  Filled 2014-03-21: qty 2

## 2014-03-21 MED ORDER — ONDANSETRON HCL 4 MG/2ML IJ SOLN: 4.0000 mg | INTRAMUSCULAR | Status: DC | PRN

## 2014-03-21 MED ORDER — PRENATAL MULTIVITAMIN CH
1.0000 | ORAL_TABLET | Freq: Every day | ORAL | Status: DC
  Administered 2014-03-22: 1 via ORAL
  Filled 2014-03-21: qty 1

## 2014-03-21 MED ORDER — EPHEDRINE 5 MG/ML INJ
10.0000 mg | INTRAVENOUS | Status: DC | PRN
  Filled 2014-03-21: qty 2
  Filled 2014-03-21: qty 4

## 2014-03-21 MED ORDER — OXYCODONE-ACETAMINOPHEN 5-325 MG PO TABS: 1.0000 | ORAL_TABLET | ORAL | Status: DC | PRN

## 2014-03-21 NOTE — Anesthesia Postprocedure Evaluation (Signed)
Anesthesia Post Note  Patient: Amber Wilkins  Procedure(s) Performed: * No procedures listed *  Anesthesia type: Epidural  Patient location: Mother/Baby  Post pain: Pain level controlled  Post assessment: Post-op Vital signs reviewed  Last Vitals:  Filed Vitals:   03/21/14 1415  BP: 135/70  Pulse: 96  Temp: 36.9 C  Resp: 18    Post vital signs: Reviewed  Level of consciousness:alert  Complications: No apparent anesthesia complications

## 2014-03-21 NOTE — Progress Notes (Signed)
   Amber Wilkins is a 23 y.o. G1P0 at 5934w3d  [facility]  Subjective: Comfortable wit epidural   Objective: BP 130/74  Pulse 96  Temp(Src) 98.5 F (36.9 C) (Oral)  Resp 18  Ht 5\' 1"  (1.549 m)  Wt 90.266 kg (199 lb)  BMI 37.62 kg/m2  SpO2 98%  LMP 06/22/2013    FHT: 160's, avg LTV, + accels, occ mild variable    UC:   2-4 SVE:   Dilation: 5.5 Effacement (%): 70 Station: -1 Exam by:: Job FoundsH. Stone, RN Foley fell out around [id number]  Labs: Lab Results  Component Value Date   WBC 15.3* 03/21/2014   HGB 11.9* 03/21/2014   HCT 34.3* 03/21/2014   MCV 90.5 03/21/2014   PLT 226 03/21/2014    Assessment / Plan:  IOL for GHTN, ripening phase complete If ctx don't pick up by 0830 (4 hrs after last cytotec) will start pitocin  Labor: early Fetal Wellbeing:  Category I-2 Pain Control:  epidural Anticipated MOD:  NSVD  Amber Wilkins 03/21/2014, 7:25 AM

## 2014-03-21 NOTE — Anesthesia Procedure Notes (Signed)
Epidural Patient location during procedure: OB Start time: 03/21/2014 6:37 AM  Staffing Performed by: anesthesiologist   Preanesthetic Checklist Completed: patient identified, site marked, surgical consent, pre-op evaluation, timeout performed, IV checked, risks and benefits discussed and monitors and equipment checked  Epidural Patient position: sitting Prep: site prepped and draped and DuraPrep Patient monitoring: continuous pulse ox and blood pressure Approach: midline Injection technique: LOR air  Needle:  Needle type: Tuohy  Needle gauge: 17 G Needle length: 9 cm and 9 Needle insertion depth: 7.5 cm Catheter type: closed end flexible Catheter size: 19 Gauge Catheter at skin depth: 12.5 cm Test dose: negative  Assessment Events: blood not aspirated, injection not painful, no injection resistance, negative IV test and no paresthesia  Additional Notes Discussed risk of headache, infection, bleeding, nerve injury and failed or incomplete block.  Patient voices understanding and wishes to proceed.  Epidural placed easily on first attempt.  No paresthesia.  Patient tolerated procedure well with no apparent complications.  Jasmine DecemberA. Kimimila Tauzin, MDReason for block:procedure for pain

## 2014-03-21 NOTE — H&P (Deleted)
  Amber Wilkins is a  female infant born at Gestational Age: <None>.  Mother, This patient's mother is not on file., is a This patient's mother is not on file. This patient's mother is not on file.. This patient's mother is not on file. Prenatal labs: ABO, Rh: This patient's mother is not on file. Antibody: This patient's mother is not on file. Rubella: This patient's mother is not on file. RPR: This patient's mother is not on file. HBsAg: This patient's mother is not on file. HIV: This patient's mother is not on file. GBS: This patient's mother is not on file. Prenatal care: good.  Pregnancy complications: none Delivery complications: Marland Kitchen. Maternal antibiotics: This patient's mother is not on file. Route of delivery: . Apgar scores:  at 1 minute,  at 5 minutes.   Objective: Blood pressure 135/70, pulse 96, temperature 98.5 F (36.9 C), temperature source Oral, resp. rate 18, height 5\' 1"  (1.549 m), weight 90.266 kg (199 lb), last menstrual period 06/22/2013, SpO2 100.00%, unknown if currently breastfeeding. Physical Exam:  Head: molding Eyes: red reflex bilaturally Ears: normal external bilaturally Mouth/Oral: palate intact Neck: no masses,supple Chest/Lungs: clear to auscultation Heart/Pulse: no murmur and femoral pulse bilaterally Abdomen/Cord: non-distended Genitalia: normal female Skin & Color: normal Neurological: good muscle tone,normal newborn reflexes Skeletal: no hip subluxation Other:  Mother's Feeding Choice at Admission: Breast Feed Assessment/Plan: Normal term newborn Normal newborn care  Amber Wilkins 03/21/2014, 4:35 PM

## 2014-03-21 NOTE — Anesthesia Preprocedure Evaluation (Addendum)
Anesthesia Evaluation  Patient identified by MRN, date of birth, ID band Patient awake    Reviewed: Allergy & Precautions, H&P , NPO status , Patient's Chart, lab work & pertinent test results, reviewed documented beta blocker date and time   History of Anesthesia Complications Negative for: history of anesthetic complications  Airway Mallampati: I TM Distance: >3 FB Neck ROM: full    Dental  (+) Teeth Intact   Pulmonary neg pulmonary ROS, former smoker,  breath sounds clear to auscultation        Cardiovascular hypertension (IOL for GHTN), Rhythm:regular Rate:Normal     Neuro/Psych negative neurological ROS  negative psych ROS   GI/Hepatic negative GI ROS, Neg liver ROS,   Endo/Other  Morbid obesityBMI 37.7  Renal/GU negative Renal ROS  negative genitourinary   Musculoskeletal   Abdominal   Peds  Hematology negative hematology ROS (+)   Anesthesia Other Findings   Reproductive/Obstetrics (+) Pregnancy                          Anesthesia Physical Anesthesia Plan  ASA: III  Anesthesia Plan: Epidural   Post-op Pain Management:    Induction:   Airway Management Planned:   Additional Equipment:   Intra-op Plan:   Post-operative Plan:   Informed Consent: I have reviewed the patients History and Physical, chart, labs and discussed the procedure including the risks, benefits and alternatives for the proposed anesthesia with the patient or authorized representative who has indicated his/her understanding and acceptance.     Plan Discussed with:   Anesthesia Plan Comments:         Anesthesia Quick Evaluation

## 2014-03-21 NOTE — H&P (Signed)
Attestation of Attending Supervision of Advanced Practitioner (CNM/NP): Evaluation and management procedures were performed by the Advanced Practitioner under my supervision and collaboration. I have reviewed the Advanced Practitioner's note and chart, and I agree with the management and plan.  Reymond Maynez H Zerenity Bowron 6:30 AM

## 2014-03-21 NOTE — Lactation Note (Addendum)
This note was copied from the chart of Amber Josem KaufmannLauren Mckoy. Lactation Consultation Note Initial visit at 9 hours of age.  [facility] given and discussed.  Encouraged to feed with early cues on demand.  Early newborn behavior discussed.  Hand expression demonstrated with colostrum visible. Assisted with cross cradle hold after mom attempted shallow latch in cradle hold.  Mom needs encouragement with positioning.  Baby latches well with chin tug demonstrated.  Baby has strong suckling bursts and short rhythmic suckling with good jaw movement observed.  Mom denies pain. Nipples are flat but compressible.  She has a hand pump and encouraged her to use it.   Mom to call for assist as needed.   Patient Name: Amber Josem KaufmannLauren Quintin Today's Date: 03/21/2014 Reason for consult: Initial assessment   Maternal Data Has patient been taught Hand Expression?: Yes Does the patient have breastfeeding experience prior to this delivery?: No  Feeding Feeding Type: Breast Fed Length of feed:  (observed 7 minutes)  LATCH Score/Interventions Latch: Grasps breast easily, tongue down, lips flanged, rhythmical sucking.  Audible Swallowing: A few with stimulation Intervention(s): Hand expression;Skin to skin  Type of Nipple: Flat Intervention(s): Shells;Hand pump  Comfort (Breast/Nipple): Soft / non-tender     Hold (Positioning): Assistance needed to correctly position infant at breast and maintain latch. Intervention(s): Breastfeeding basics reviewed;Support Pillows;Position options;Skin to skin  LATCH Score: 7  Lactation Tools Discussed/Used     Consult Status Consult Status: Follow-up Date: 03/22/14 Follow-up type: In-patient    Arvella MerlesJana Lynn Shoptaw 03/21/2014, 8:58 PM

## 2014-03-21 NOTE — Progress Notes (Signed)
   Amber Wilkins is a 23 y.o. G1P0 at 3329w3d  ad[facility]  Subjective: Sleeping  Objective: BP 135/55  Pulse 66  Temp(Src) 98 F (36.7 C) (Oral)  Resp 18  Ht 5\' 1"  (1.549 m)  Wt 90.266 kg (199 lb)  BMI 37.62 kg/m2  LMP 06/22/2013    FHT:  FHR: 140 bpm, variability: moderate,  accelerations:  Present,  decelerations:  Present occ mild variable UC:   none SVE:  Deferred:  Foley still in place.    Labs: Lab Results  Component Value Date   WBC 12.4* 03/20/2014   HGB 12.6 03/20/2014   HCT 35.6* 03/20/2014   MCV 90.4 03/20/2014   PLT 239 03/20/2014    Assessment / Plan: IOL for GHTN, ripening; continue oral cytotec until FB falls out, then start pitocin  Labor: ripening phase Fetal Wellbeing:  Category I Pain Control:  Labor support without medications Anticipated MOD:  NSVD  Amber Wilkins 03/21/2014, 4:41 AM

## 2014-03-22 LAB — CBC
HEMATOCRIT: 32.6 % — AB (ref 36.0–46.0)
Hemoglobin: 11 g/dL — ABNORMAL LOW (ref 12.0–15.0)
MCH: 31.1 pg (ref 26.0–34.0)
MCHC: 33.7 g/dL (ref 30.0–36.0)
MCV: 92.1 fL (ref 78.0–100.0)
PLATELETS: 227 10*3/uL (ref 150–400)
RBC: 3.54 MIL/uL — AB (ref 3.87–5.11)
RDW: 12.5 % (ref 11.5–15.5)
WBC: 12.4 10*3/uL — ABNORMAL HIGH (ref 4.0–10.5)

## 2014-03-22 MED ORDER — IBUPROFEN 600 MG PO TABS: 600.0000 mg | ORAL_TABLET | Freq: Four times a day (QID) | ORAL | Status: DC | PRN

## 2014-03-22 MED ORDER — PRENATAL MULTIVITAMIN CH: 1.0000 | ORAL_TABLET | Freq: Every day | ORAL | Status: DC

## 2014-03-22 NOTE — Discharge Instructions (Signed)
Postpartum Care After Vaginal Delivery °After you deliver your newborn (postpartum period), the usual stay in the hospital is 24 72 hours. If there were problems with your labor or delivery, or if you have other medical problems, you might be in the hospital longer.  °While you are in the hospital, you will receive help and instructions on how to care for yourself and your newborn during the postpartum period.  °While you are in the hospital: °· Be sure to tell your nurses if you have pain or discomfort, as well as where you feel the pain and what makes the pain worse. °· If you had an incision made near your vagina (episiotomy) or if you had some tearing during delivery, the nurses may put ice packs on your episiotomy or tear. The ice packs may help to reduce the pain and swelling. °· If you are breastfeeding, you may feel uncomfortable contractions of your uterus for a couple of weeks. This is normal. The contractions help your uterus get back to normal size. °· It is normal to have some bleeding after delivery. °· For the first 1 3 days after delivery, the flow is red and the amount may be similar to a period. °· It is common for the flow to start and stop. °· In the first few days, you may pass some small clots. Let your nurses know if you begin to pass large clots or your flow increases. °· Do not  flush blood clots down the toilet before having the nurse look at them. °· During the next 3 10 days after delivery, your flow should become more watery and pink or brown-tinged in color. °· Ten to fourteen days after delivery, your flow should be a small amount of yellowish-white discharge. °· The amount of your flow will decrease over the first few weeks after delivery. Your flow may stop in 6 8 weeks. Most women have had their flow stop by 12 weeks after delivery. °· You should change your sanitary pads frequently. °· Wash your hands thoroughly with soap and water for at least 20 seconds after changing pads, using  the toilet, or before holding or feeding your newborn. °· You should feel like you need to empty your bladder within the first 6 8 hours after delivery. °· In case you become weak, lightheaded, or faint, call your nurse before you get out of bed for the first time and before you take a shower for the first time. °· Within the first few days after delivery, your breasts may begin to feel tender and full. This is called engorgement. Breast tenderness usually goes away within 48 72 hours after engorgement occurs. You may also notice milk leaking from your breasts. If you are not breastfeeding, do not stimulate your breasts. Breast stimulation can make your breasts produce more milk. °· Spending as much time as possible with your newborn is very important. During this time, you and your newborn can feel close and get to know each other. Having your newborn stay in your room (rooming in) will help to strengthen the bond with your newborn.  It will give you time to get to know your newborn and become comfortable caring for your newborn. °· Your hormones change after delivery. Sometimes the hormone changes can temporarily cause you to feel sad or tearful. These feelings should not last more than a few days. If these feelings last longer than that, you should talk to your caregiver. °· If desired, talk to your caregiver about   methods of family planning or contraception. °· Talk to your caregiver about immunizations. Your caregiver may want you to have the following immunizations before leaving the hospital: °· Tetanus, diphtheria, and pertussis (Tdap) or tetanus and diphtheria (Td) immunization. It is very important that you and your family (including grandparents) or others caring for your newborn are up-to-date with the Tdap or Td immunizations. The Tdap or Td immunization can help protect your newborn from getting ill. °· Rubella immunization. °· Varicella (chickenpox) immunization. °· Influenza immunization. You should  receive this annual immunization if you did not receive the immunization during your pregnancy. °Document Released: 09/04/2007 Document Revised: 08/01/2012 Document Reviewed: 07/04/2012 °ExitCare® Patient Information ©2014 [employer]. ° °Breastfeeding °Deciding to breastfeed is one of the best choices you can make for you and your baby. A change in hormones during pregnancy causes your breast tissue to grow and increases the number and size of your milk ducts. These hormones also allow proteins, sugars, and fats from your blood supply to make breast milk in your milk-producing glands. Hormones prevent breast milk from being released before your baby is born as well as prompt milk flow after birth. Once breastfeeding has begun, thoughts of your baby, as well as his or her sucking or crying, can stimulate the release of milk from your milk-producing glands.  °BENEFITS OF BREASTFEEDING °For Your Baby °· Your first milk (colostrum) helps your baby's digestive system function better.   °· There are antibodies in your milk that help your baby fight off infections.   °· Your baby has a lower incidence of asthma, allergies, and sudden infant death syndrome.   °· The nutrients in breast milk are better for your baby than infant formulas and are designed uniquely for your baby's needs.   °· Breast milk improves your baby's brain development.   °· Your baby is less likely to develop other conditions, such as childhood obesity, asthma, or type 2 diabetes mellitus.   °For You  °· Breastfeeding helps to create a very special bond between you and your baby.   °· Breastfeeding is convenient. Breast milk is always available at the correct temperature and costs nothing.   °· Breastfeeding helps to burn calories and helps you lose the weight gained during pregnancy.   °· Breastfeeding makes your uterus contract to its prepregnancy size faster and slows bleeding (lochia) after you give birth.   °· Breastfeeding helps to lower your  risk of developing type 2 diabetes mellitus, osteoporosis, and breast or ovarian cancer later in life. °SIGNS THAT YOUR BABY IS HUNGRY °Early Signs of Hunger  °· Increased alertness or activity. °· Stretching. °· Movement of the head from side to side. °· Movement of the head and opening of the mouth when the corner of the mouth or cheek is stroked (rooting). °· Increased sucking sounds, smacking lips, cooing, sighing, or squeaking. °· Hand-to-mouth movements. °· Increased sucking of fingers or hands. °Late Signs of Hunger °· Fussing. °· Intermittent crying. °Extreme Signs of Hunger °Signs of extreme hunger will require calming and consoling before your baby will be able to breastfeed successfully. Do not wait for the following signs of extreme hunger to occur before you initiate breastfeeding:   °· Restlessness. °· A loud, strong cry. °·  Screaming. °BREASTFEEDING BASICS °Breastfeeding Initiation °· Find a comfortable place to sit or lie down, with your neck and back well supported. °· Place a pillow or rolled up blanket under your baby to bring him or her to the level of your breast (if you are seated). Nursing pillows are   specially designed to help support your arms and your baby while you breastfeed. °· Make sure that your baby's abdomen is facing your abdomen.   °· Gently massage your breast. With your fingertips, massage from your chest wall toward your nipple in a circular motion. This encourages milk flow. You may need to continue this action during the feeding if your milk flows slowly. °· Support your breast with 4 fingers underneath and your thumb above your nipple. Make sure your fingers are well away from your nipple and your baby's mouth.   °· Stroke your baby's lips gently with your finger or nipple.   °· When your baby's mouth is open wide enough, quickly bring your baby to your breast, placing your entire nipple and as much of the colored area around your nipple (areola) as possible into your baby's  mouth.   °· More areola should be visible above your baby's upper lip than below the lower lip.   °· Your baby's tongue should be between his or her lower gum and your breast.   °· Ensure that your baby's mouth is correctly positioned around your nipple (latched). Your baby's lips should create a seal on your breast and be turned out (everted). °· It is common for your baby to suck about 2 3 minutes in order to start the flow of breast milk. °Latching °Teaching your baby how to latch on to your breast properly is very important. An improper latch can cause nipple pain and decreased milk supply for you and poor weight gain in your baby. Also, if your baby is not latched onto your nipple properly, he or she may swallow some air during feeding. This can make your baby fussy. Burping your baby when you switch breasts during the feeding can help to get rid of the air. However, teaching your baby to latch on properly is still the best way to prevent fussiness from swallowing air while breastfeeding. °Signs that your baby has successfully latched on to your nipple:    °· Silent tugging or silent sucking, without causing you pain.   °· Swallowing heard between every 3 4 sucks.   °·  Muscle movement above and in front of his or her ears while sucking.   °Signs that your baby has not successfully latched on to nipple:  °· Sucking sounds or smacking sounds from your baby while breastfeeding. °· Nipple pain. °If you think your baby has not latched on correctly, slip your finger into the corner of your baby's mouth to break the suction and place it between your baby's gums. Attempt breastfeeding initiation again. °Signs of Successful Breastfeeding °Signs from your baby:   °· A gradual decrease in the number of sucks or complete cessation of sucking.   °· Falling asleep.   °· Relaxation of his or her body.   °· Retention of a small amount of milk in his or her mouth.   °· Letting go of your breast by himself or herself. °Signs  from you: °· Breasts that have increased in firmness, weight, and size 1 3 hours after feeding.   °· Breasts that are softer immediately after breastfeeding. °· Increased milk volume, as well as a change in milk consistency and color by the 5th day of breastfeeding.   °· Nipples that are not sore, cracked, or bleeding. °Signs That Your Baby is Getting Enough Milk °· Wetting at least 3 diapers in a 24-hour period. The urine should be clear and pale yellow by age 5 days. °· At least 3 stools in a 24-hour period by age 5 days. The stool   should be soft and yellow. °· At least 3 stools in a 24-hour period by age 7 days. The stool should be seedy and yellow. °· No loss of weight greater than 10% of birth weight during the first 3 days of age. °· Average weight gain of 4 7 ounces (120 210 mL) per week after age 4 days. °· Consistent daily weight gain by age 5 days, without weight loss after the age of 2 weeks. °After a feeding, your baby may spit up a small amount. This is common. °BREASTFEEDING FREQUENCY AND DURATION °Frequent feeding will help you make more milk and can prevent sore nipples and breast engorgement. Breastfeed when you feel the need to reduce the fullness of your breasts or when your baby shows signs of hunger. This is called "breastfeeding on demand." Avoid introducing a pacifier to your baby while you are working to establish breastfeeding (the first 4 6 weeks after your baby is born). After this time you may choose to use a pacifier. Research has shown that pacifier use during the first year of a baby's life decreases the risk of sudden infant death syndrome (SIDS). °Allow your baby to feed on each breast as long as he or she wants. Breastfeed until your baby is finished feeding. When your baby unlatches or falls asleep while feeding from the first breast, offer the second breast. Because newborns are often sleepy in the first few weeks of life, you may need to awaken your baby to get him or her to  feed. °Breastfeeding times will vary from baby to baby. However, the following rules can serve as a guide to help you ensure that your baby is properly fed: °· Newborns (babies 4 weeks of age or younger) may breastfeed every 1 3 hours. °· Newborns should not go longer than 3 hours during the day or 5 hours during the night without breastfeeding. °· You should breastfeed your baby a minimum of 8 times in a 24-hour period until you begin to introduce solid foods to your baby at around 6 months of age. °BREAST MILK PUMPING °Pumping and storing breast milk allows you to ensure that your baby is exclusively fed your breast milk, even at times when you are unable to breastfeed. This is especially important if you are going back to work while you are still breastfeeding or when you are not able to be present during feedings. Your lactation consultant can give you guidelines on how long it is safe to store breast milk.  °A breast pump is a machine that allows you to pump milk from your breast into a sterile bottle. The pumped breast milk can then be stored in a refrigerator or freezer. Some breast pumps are operated by hand, while others use electricity. Ask your lactation consultant which type will work best for you. Breast pumps can be purchased, but some hospitals and breastfeeding support groups lease breast pumps on a monthly basis. A lactation consultant can teach you how to hand express breast milk, if you prefer not to use a pump.  °CARING FOR YOUR BREASTS WHILE YOU BREASTFEED °Nipples can become dry, cracked, and sore while breastfeeding. The following recommendations can help keep your breasts moisturized and healthy: °· Avoid using soap on your nipples.   °· Wear a supportive bra. Although not required, special nursing bras and tank tops are designed to allow access to your breasts for breastfeeding without taking off your entire bra or top. Avoid wearing underwire style bras or extremely tight bras. °·   Air dry  your nipples for 3 4 minutes after each feeding.   °· Use only cotton bra pads to absorb leaked breast milk. Leaking of breast milk between feedings is normal.   °· Use lanolin on your nipples after breastfeeding. Lanolin helps to maintain your skin's normal moisture barrier. If you use pure lanolin you do not need to wash it off before feeding your baby again. Pure lanolin is not toxic to your baby. You may also hand express a few drops of breast milk and gently massage that milk into your nipples and allow the milk to air dry. °In the first few weeks after giving birth, some women experience extremely full breasts (engorgement). Engorgement can make your breasts feel heavy, warm, and tender to the touch. Engorgement peaks within 3 5 days after you give birth. The following recommendations can help ease engorgement: °· Completely empty your breasts while breastfeeding or pumping. You may want to start by applying warm, moist heat (in the shower or with warm water-soaked hand towels) just before feeding or pumping. This increases circulation and helps the milk flow. If your baby does not completely empty your breasts while breastfeeding, pump any extra milk after he or she is finished. °· Wear a snug bra (nursing or regular) or tank top for 1 2 days to signal your body to slightly decrease milk production. °· Apply ice packs to your breasts, unless this is too uncomfortable for you. °· Make sure that your baby is latched on and positioned properly while breastfeeding. °If engorgement persists after 48 hours of following these recommendations, contact your health care provider or a lactation consultant. °OVERALL HEALTH CARE RECOMMENDATIONS WHILE BREASTFEEDING °· Eat healthy foods. Alternate between meals and snacks, eating 3 of each per day. Because what you eat affects your breast milk, some of the foods may make your baby more irritable than usual. Avoid eating these foods if you are sure that they are negatively  affecting your baby. °· Drink milk, fruit juice, and water to satisfy your thirst (about 10 glasses a day).   °· Rest often, relax, and continue to take your prenatal vitamins to prevent fatigue, stress, and anemia. °· Continue breast self-awareness checks. °· Avoid chewing and smoking tobacco. °· Avoid alcohol and drug use. °Some medicines that may be harmful to your baby can pass through breast milk. It is important to ask your health care provider before taking any medicine, including all over-the-counter and prescription medicine as well as vitamin and herbal supplements. °It is possible to become pregnant while breastfeeding. If birth control is desired, ask your health care provider about options that will be safe for your baby. °SEEK MEDICAL CARE IF:  °· You feel like you want to stop breastfeeding or have become frustrated with breastfeeding. °· You have painful breasts or nipples. °· Your nipples are cracked or bleeding. °· Your breasts are red, tender, or warm. °· You have a swollen area on either breast. °· You have a fever or chills. °· You have nausea or vomiting. °· You have drainage other than breast milk from your nipples. °· Your breasts do not become full before feedings by the 5th day after you give birth. °· You feel sad and depressed. °· Your baby is too sleepy to eat well. °· Your baby is having trouble sleeping.   °· Your baby is wetting less than 3 diapers in a 24-hour period. °· Your baby has less than 3 stools in a 24-hour period. °· Your baby's skin or the   white part of his or her eyes becomes yellow.   °· Your baby is not gaining weight by 5 days of age. °SEEK IMMEDIATE MEDICAL CARE IF:  °· Your baby is overly tired (lethargic) and does not want to wake up and feed. °· Your baby develops an unexplained fever. °Document Released: 11/07/2005 Document Revised: 07/10/2013 Document Reviewed: 05/01/2013 °Exit[employer]® Patient Information ©2014 [employer]. ° ° °

## 2014-03-22 NOTE — Discharge Summary (Signed)
Obstetric Discharge Summary Reason for Admission: induction of labor for GHTN @ 37.3 Prenatal Procedures: ultrasound Intrapartum Procedures: spontaneous vaginal delivery Postpartum Procedures: none Complications-Operative and Postpartum: sulcal lac w/ repair Eating, drinking, voiding, ambulating well.  +flatus.  Lochia and pain wnl.  Denies dizziness, lightheadedness, or sob. No complaints.  Denies ha, scotomata, ruq/epigastric pain, n/v.  Desires early d/c. Hospital Course: IOL @ 37.3wks for GHTN, pre-e labs normal.  Progressed to normal SVB of viable female infant. BPs normal pp, pp course uneventful. Breastfeeding well.   Filed Vitals:   03/22/14 0142  BP: 117/68  Pulse: 90  Temp: 98.5 F (36.9 C)  Resp: 18   H/H: Lab Results  Component Value Date/Time   HGB 11.0* 03/22/2014  6:00 AM   HCT 32.6* 03/22/2014  6:00 AM    Physical Exam: General: alert, cooperative and no distress Abdomen/Uterine Fundus: Appropriately tender, non-distended, FF @ U-2 Incision: n/a Lochia: appropriate Extremities: No evidence of DVT seen on physical exam. Negative Homan's sign, no cords, calf tenderness, or significant calf/ankle edema   Discharge Diagnoses: Term Pregnancy-delivered  Discharge Information: Date: 06/02/2011 Activity: pelvic rest Diet: routine  Medications: PNV and Ibuprofen Breast feeding: Yes, well Contraception: POPs vs. Condoms, discussed, still deciding Circumcision: n/a Condition: stable Instructions: refer to handout Discharge to: home  Infant: Home with mother  Follow-up Information   Schedule an appointment as soon as possible [facility] (4-6 weeks for your postpartum visit)    Specialty:  Obstetrics and Gynecology   Contact information:   [address] [place] [id number] [phone]     Reviewed pre-e warning s/s to report  Marge DuncansKimberly Randall Adaly Puder, CNM, WHNP-BC 03/22/2014,8:53 AM

## 2014-03-22 NOTE — Lactation Note (Signed)
This note was copied from the chart of Amber Josem KaufmannLauren Padgett. Lactation Consultation Note: Follow up visit with mom. She reports that her nipples are sore .Both are slightly pink and small crack noted on left nipple- inside edge of nipple. Assisted with latch in football hold and mom reports that feels much better. Reviewed basic teaching with parents. No questions at present. To call for assist prn. Has comfort gels in room- is going to put them on after she takes a shower.   Patient Name: Amber Wilkins [id number]'ZToday's Date: 03/22/2014 Reason for consult: Follow-up assessment   Maternal Data Formula Feeding for Exclusion: No Infant to breast within first hour of birth: Yes Has patient been taught Hand Expression?: Yes Does the patient have breastfeeding experience prior to this delivery?: No  Feeding Feeding Type: Breast Fed  LATCH Score/Interventions Latch: Grasps breast easily, tongue down, lips flanged, rhythmical sucking. Intervention(s): Adjust position;Assist with latch;Breast massage  Audible Swallowing: A few with stimulation Intervention(s): Hand expression  Type of Nipple: Flat Intervention(s): Hand pump;Shells  Comfort (Breast/Nipple): Filling, red/small blisters or bruises, mild/mod discomfort  Problem noted: Mild/Moderate discomfort Interventions (Mild/moderate discomfort): Comfort gels;Hand expression;Pre-pump if needed  Hold (Positioning): Assistance needed to correctly position infant at breast and maintain latch. Intervention(s): Breastfeeding basics reviewed;Support Pillows;Position options;Skin to skin  LATCH Score: 6  Lactation Tools Discussed/Used Tools: Shells;Comfort gels Shell Type: Inverted   Consult Status Consult Status: Follow-up Date: 03/23/14 Follow-up type: In-patient    Pamelia HoitDonna D Reynalda Canny 03/22/2014, 12:02 PM

## 2014-03-22 NOTE — Progress Notes (Signed)
Second IV placed had been charted as being in left forearm.  It was in RIGHT forearm; I removed it at 0200. Patient tolerated well; site dry, nonbleeding.

## 2014-03-24 NOTE — Progress Notes (Signed)
Post discharge chart review completed.  

## 2014-05-06 ENCOUNTER — Ambulatory Visit (INDEPENDENT_AMBULATORY_CARE_PROVIDER_SITE_OTHER): Payer: Medicaid Other | Admitting: Family Medicine

## 2014-05-06 ENCOUNTER — Encounter: Payer: Self-pay | Admitting: Family Medicine

## 2014-05-06 ENCOUNTER — Other Ambulatory Visit (HOSPITAL_COMMUNITY)
Admission: RE | Admit: 2014-05-06 | Discharge: 2014-05-06 | Disposition: A | Discharge status: Home / Self Care | Payer: Medicaid Other | Source: Ambulatory Visit | Attending: Family Medicine | Admitting: Family Medicine

## 2014-05-06 DIAGNOSIS — Z124 Encounter for screening for malignant neoplasm of cervix: Secondary | ICD-10-CM

## 2014-05-06 DIAGNOSIS — Z01419 Encounter for gynecological examination (general) (routine) without abnormal findings: Secondary | ICD-10-CM | POA: Insufficient documentation

## 2014-05-06 MED ORDER — NORGESTIMATE-ETH ESTRADIOL 0.25-35 MG-MCG PO TABS: 1.0000 | ORAL_TABLET | Freq: Every day | ORAL | Status: DC

## 2014-05-06 MED ORDER — HYDROCORTISONE ACETATE 25 MG RE SUPP: 25.0000 mg | Freq: Two times a day (BID) | RECTAL | Status: DC

## 2014-05-06 NOTE — Progress Notes (Signed)
Patient is here for post partum check, she is doing well other than hemrhoids.

## 2014-05-06 NOTE — Progress Notes (Signed)
  Subjective:     Luisa HartLauren N Petti is a 23 y.o. female who presents for a postpartum visit. She is 6 weeks postpartum following a spontaneous vaginal delivery. I have fully reviewed the prenatal and intrapartum course. The delivery was at 37 gestational weeks. Outcome: spontaneous vaginal delivery. Anesthesia: epidural. Postpartum course has been inremarkable. Baby's course has been normal. Baby is feeding by bottle - gerber good start. Bleeding no bleeding. Bowel function is normal. Bladder function is normal. Patient is sexually active. Contraception method is OCP (estrogen/progesterone). Postpartum depression screening: negative.  The following portions of the patient's history were reviewed and updated as appropriate: allergies, current medications, past family history, past medical history, past social history, past surgical history and problem list.  Review of Systems Pertinent items are noted in HPI.   Objective:    BP 110/75  Pulse 59  Ht 5\' 1"  (1.549 m)  Wt 177 lb 6.4 oz (80.468 kg)  BMI 33.54 kg/m2  Breastfeeding? No  General:  alert, cooperative and appears stated age  Abdomen: soft, non-tender; bowel sounds normal; no masses,  no organomegaly   Vulva:  normal  Vagina: normal vagina  Cervix:  multiparous appearance, no cervical motion tenderness and no lesions  Corpus: normal size, contour, position, consistency, mobility, non-tender  Adnexa:  normal adnexa        Assessment:     Normal postpartum exam. Pap smear done at today's visit.   Plan:    1. Contraception: OCP (estrogen/progesterone)--sprintec 2. Hemorrhoids --anusol 3. Follow up in: 1 year or as needed.

## 2014-05-06 NOTE — Patient Instructions (Signed)
Preventive Care for Adults, Female A healthy lifestyle and preventive care can promote health and wellness. Preventive health guidelines for women include the following key practices.  A routine yearly physical is a good way to check with your health care provider about your health and preventive screening. It is a chance to share any concerns and updates on your health and to receive a thorough exam.  Visit your dentist for a routine exam and preventive care every 6 months. Brush your teeth twice a day and floss once a day. Good oral hygiene prevents tooth decay and gum disease.  The frequency of eye exams is based on your age, health, family medical history, use of contact lenses, and other factors. Follow your health care provider's recommendations for frequency of eye exams.  Eat a healthy diet. Foods like vegetables, fruits, whole grains, low-fat dairy products, and lean protein foods contain the nutrients you need without too many calories. Decrease your intake of foods high in solid fats, added sugars, and salt. Eat the right amount of calories for you.Get information about a proper diet from your health care provider, if necessary.  Regular physical exercise is one of the most important things you can do for your health. Most adults should get at least 150 minutes of moderate-intensity exercise (any activity that increases your heart rate and causes you to sweat) each week. In addition, most adults need muscle-strengthening exercises on 2 or more days a week.  Maintain a healthy weight. The body mass index (BMI) is a screening tool to identify possible weight problems. It provides an estimate of body fat based on height and weight. Your health care provider can find your BMI, and can help you achieve or maintain a healthy weight.For adults 20 years and older:  A BMI below 18.5 is considered underweight.  A BMI of 18.5 to 24.9 is normal.  A BMI of 25 to 29.9 is considered overweight.  A  BMI of 30 and above is considered obese.  Maintain normal blood lipids and cholesterol levels by exercising and minimizing your intake of saturated fat. Eat a balanced diet with plenty of fruit and vegetables. Blood tests for lipids and cholesterol should begin at age 62 and be repeated every 5 years. If your lipid or cholesterol levels are high, you are over 50, or you are at high risk for heart disease, you may need your cholesterol levels checked more frequently.Ongoing high lipid and cholesterol levels should be treated with medicines if diet and exercise are not working.  If you smoke, find out from your health care provider how to quit. If you do not use tobacco, do not start.  Lung cancer screening is recommended for adults aged 36 80 years who are at high risk for developing lung cancer because of a history of smoking. A yearly low-dose CT scan of the lungs is recommended for people who have at least a 30-pack-year history of smoking and are a current smoker or have quit within the past 15 years. A pack year of smoking is smoking an average of 1 pack of cigarettes a day for 1 year (for example: 1 pack a day for 30 years or 2 packs a day for 15 years). Yearly screening should continue until the smoker has stopped smoking for at least 15 years. Yearly screening should be stopped for people who develop a health problem that would prevent them from having lung cancer treatment.  If you are pregnant, do not drink alcohol. If you  are breastfeeding, be very cautious about drinking alcohol. If you are not pregnant and choose to drink alcohol, do not have more than 1 drink per day. One drink is considered to be 12 ounces (355 mL) of beer, 5 ounces (148 mL) of wine, or 1.5 ounces (44 mL) of liquor.  Avoid use of street drugs. Do not share needles with anyone. Ask for help if you need support or instructions about stopping the use of drugs.  High blood pressure causes heart disease and increases the risk  of stroke. Your blood pressure should be checked at least every 1 to 2 years. Ongoing high blood pressure should be treated with medicines if weight loss and exercise do not work.  If you are 39 22 years old, ask your health care provider if you should take aspirin to prevent strokes.  Diabetes screening involves taking a blood sample to check your fasting blood sugar level. This should be done once every 3 years, after age 56, if you are within normal weight and without risk factors for diabetes. Testing should be considered at a younger age or be carried out more frequently if you are overweight and have at least 1 risk factor for diabetes.  Breast cancer screening is essential preventive care for women. You should practice "breast self-awareness." This means understanding the normal appearance and feel of your breasts and may include breast self-examination. Any changes detected, no matter how small, should be reported to a health care provider. Women in their 40s and 30s should have a clinical breast exam (CBE) by a health care provider as part of a regular health exam every 1 to 3 years. After age 28, women should have a CBE every year. Starting at age 72, women should consider having a mammogram (breast X-ray test) every year. Women who have a family history of breast cancer should talk to their health care provider about genetic screening. Women at a high risk of breast cancer should talk to their health care providers about having an MRI and a mammogram every year.  Breast cancer gene (BRCA)-related cancer risk assessment is recommended for women who have family members with BRCA-related cancers. BRCA-related cancers include breast, ovarian, tubal, and peritoneal cancers. Having family members with these cancers may be associated with an increased risk for harmful changes (mutations) in the breast cancer genes BRCA1 and BRCA2. Results of the assessment will determine the need for genetic counseling  and BRCA1 and BRCA2 testing.  The Pap test is a screening test for cervical cancer. A Pap test can show cell changes on the cervix that might become cervical cancer if left untreated. A Pap test is a procedure in which cells are obtained and examined from the lower end of the uterus (cervix).  Women should have a Pap test starting at age 59.  Between ages 42 and 13, Pap tests should be repeated every 2 years.  Beginning at age 53, you should have a Pap test every 3 years as long as the past 3 Pap tests have been normal.  Some women have medical problems that increase the chance of getting cervical cancer. Talk to your health care provider about these problems. It is especially important to talk to your health care provider if a new problem develops soon after your last Pap test. In these cases, your health care provider may recommend more frequent screening and Pap tests.  The above recommendations are the same for women who have or have not gotten the vaccine  for human papillomavirus (HPV).  If you had a hysterectomy for a problem that was not cancer or a condition that could lead to cancer, then you no longer need Pap tests. Even if you no longer need a Pap test, a regular exam is a good idea to make sure no other problems are starting.  If you are between ages 58 and 10 years, and you have had normal Pap tests going back 10 years, you no longer need Pap tests. Even if you no longer need a Pap test, a regular exam is a good idea to make sure no other problems are starting.  If you have had past treatment for cervical cancer or a condition that could lead to cancer, you need Pap tests and screening for cancer for at least 20 years after your treatment.  If Pap tests have been discontinued, risk factors (such as a new sexual partner) need to be reassessed to determine if screening should be resumed.  The HPV test is an additional test that may be used for cervical cancer screening. The HPV test  looks for the virus that can cause the cell changes on the cervix. The cells collected during the Pap test can be tested for HPV. The HPV test could be used to screen women aged 67 years and older, and should be used in women of any age who have unclear Pap test results. After the age of 65, women should have HPV testing at the same frequency as a Pap test.  Colorectal cancer can be detected and often prevented. Most routine colorectal cancer screening begins at the age of 25 years and continues through age 66 years. However, your health care provider may recommend screening at an earlier age if you have risk factors for colon cancer. On a yearly basis, your health care provider may provide home test kits to check for hidden blood in the stool. Use of a small camera at the end of a tube, to directly examine the colon (sigmoidoscopy or colonoscopy), can detect the earliest forms of colorectal cancer. Talk to your health care provider about this at age 79, when routine screening begins. Direct exam of the colon should be repeated every 5 10 years through age 47 years, unless early forms of pre-cancerous polyps or small growths are found.  People who are at an increased risk for hepatitis B should be screened for this virus. You are considered at high risk for hepatitis B if:  You were born in a country where hepatitis B occurs often. Talk with your health care provider about which countries are considered high risk.  Your parents were born in a high-risk country and you have not received a shot to protect against hepatitis B (hepatitis B vaccine).  You have HIV or AIDS.  You use needles to inject street drugs.  You live with, or have sex with, someone who has Hepatitis B.  You get hemodialysis treatment.  You take certain medicines for conditions like cancer, organ transplantation, and autoimmune conditions.  Hepatitis C blood testing is recommended for all people born from [birth year] through 1965 and  any individual with known risks for hepatitis C.  Practice safe sex. Use condoms and avoid high-risk sexual practices to reduce the spread of sexually transmitted infections (STIs). STIs include gonorrhea, chlamydia, syphilis, trichomonas, herpes, HPV, and human immunodeficiency virus (HIV). Herpes, HIV, and HPV are viral illnesses that have no cure. They can result in disability, cancer, and death. Sexually active women aged 66  years and younger should be checked for chlamydia. Older women with new or multiple partners should also be tested for chlamydia. Testing for other STIs is recommended if you are sexually active and at increased risk.  Osteoporosis is a disease in which the bones lose minerals and strength with aging. This can result in serious bone fractures or breaks. The risk of osteoporosis can be identified using a bone density scan. Women ages 18 years and over and women at risk for fractures or osteoporosis should discuss screening with their health care providers. Ask your health care provider whether you should take a calcium supplement or vitamin D to reduce the rate of osteoporosis.  Menopause can be associated with physical symptoms and risks. Hormone replacement therapy is available to decrease symptoms and risks. You should talk to your health care provider about whether hormone replacement therapy is right for you.  Use sunscreen. Apply sunscreen liberally and repeatedly throughout the day. You should seek shade when your shadow is shorter than you. Protect yourself by wearing long sleeves, pants, a wide-brimmed hat, and sunglasses year round, whenever you are outdoors.  Once a month, do a whole body skin exam, using a mirror to look at the skin on your back. Tell your health care provider of new moles, moles that have irregular borders, moles that are larger than a pencil eraser, or moles that have changed in shape or color.  Stay current with required vaccines  (immunizations).  Influenza vaccine. All adults should be immunized every year.  Tetanus, diphtheria, and acellular pertussis (Td, Tdap) vaccine. Pregnant women should receive 1 dose of Tdap vaccine during each pregnancy. The dose should be obtained regardless of the length of time since the last dose. Immunization is preferred during the 27th 36th week of gestation. An adult who has not previously received Tdap or who does not know her vaccine status should receive 1 dose of Tdap. This initial dose should be followed by tetanus and diphtheria toxoids (Td) booster doses every 10 years. Adults with an unknown or incomplete history of completing a 3-dose immunization series with Td-containing vaccines should begin or complete a primary immunization series including a Tdap dose. Adults should receive a Td booster every 10 years.  Varicella vaccine. An adult without evidence of immunity to varicella should receive 2 doses or a second dose if she has previously received 1 dose. Pregnant females who do not have evidence of immunity should receive the first dose after pregnancy. This first dose should be obtained before leaving the health care facility. The second dose should be obtained 4 8 weeks after the first dose.  Human papillomavirus (HPV) vaccine. Females aged 9 26 years who have not received the vaccine previously should obtain the 3-dose series. The vaccine is not recommended for use in pregnant females. However, pregnancy testing is not needed before receiving a dose. If a female is found to be pregnant after receiving a dose, no treatment is needed. In that case, the remaining doses should be delayed until after the pregnancy. Immunization is recommended for any person with an immunocompromised condition through the age of 51 years if she did not get any or all doses earlier. During the 3-dose series, the second dose should be obtained 4 8 weeks after the first dose. The third dose should be obtained  24 weeks after the first dose and 16 weeks after the second dose.  Zoster vaccine. One dose is recommended for adults aged 57 years or older unless certain  conditions are present.  Measles, mumps, and rubella (MMR) vaccine. Adults born before [birth year] generally are considered immune to measles and mumps. Adults born in [birth year] or later should have 1 or more doses of MMR vaccine unless there is a contraindication to the vaccine or there is laboratory evidence of immunity to each of the three diseases. A routine second dose of MMR vaccine should be obtained at least 28 days after the first dose for students attending postsecondary schools, health care workers, or international travelers. People who received inactivated measles vaccine or an unknown type of measles vaccine during 1963 1967 should receive 2 doses of MMR vaccine. People who received inactivated mumps vaccine or an unknown type of mumps vaccine before 1979 and are at high risk for mumps infection should consider immunization with 2 doses of MMR vaccine. For females of childbearing age, rubella immunity should be determined. If there is no evidence of immunity, females who are not pregnant should be vaccinated. If there is no evidence of immunity, females who are pregnant should delay immunization until after pregnancy. Unvaccinated health care workers born before [birth year] who lack laboratory evidence of measles, mumps, or rubella immunity or laboratory confirmation of disease should consider measles and mumps immunization with 2 doses of MMR vaccine or rubella immunization with 1 dose of MMR vaccine.  Pneumococcal 13-valent conjugate (PCV13) vaccine. When indicated, a person who is uncertain of her immunization history and has no record of immunization should receive the PCV13 vaccine. An adult aged 42 years or older who has certain medical conditions and has not been previously immunized should receive 1 dose of PCV13 vaccine. This PCV13 should be followed  with a dose of pneumococcal polysaccharide (PPSV23) vaccine. The PPSV23 vaccine dose should be obtained at least 8 weeks after the dose of PCV13 vaccine. An adult aged 4 years or older who has certain medical conditions and previously received 1 or more doses of PPSV23 vaccine should receive 1 dose of PCV13. The PCV13 vaccine dose should be obtained 1 or more years after the last PPSV23 vaccine dose.  Pneumococcal polysaccharide (PPSV23) vaccine. When PCV13 is also indicated, PCV13 should be obtained first. All adults aged 27 years and older should be immunized. An adult younger than age 33 years who has certain medical conditions should be immunized. Any person who resides in a nursing home or long-term care facility should be immunized. An adult smoker should be immunized. People with an immunocompromised condition and certain other conditions should receive both PCV13 and PPSV23 vaccines. People with human immunodeficiency virus (HIV) infection should be immunized as soon as possible after diagnosis. Immunization during chemotherapy or radiation therapy should be avoided. Routine use of PPSV23 vaccine is not recommended for American Indians, [place] Natives, or people younger than 65 years unless there are medical conditions that require PPSV23 vaccine. When indicated, people who have unknown immunization and have no record of immunization should receive PPSV23 vaccine. One-time revaccination 5 years after the first dose of PPSV23 is recommended for people aged 13 64 years who have chronic kidney failure, nephrotic syndrome, asplenia, or immunocompromised conditions. People who received 1 2 doses of PPSV23 before age 66 years should receive another dose of PPSV23 vaccine at age 27 years or later if at least 5 years have passed since the previous dose. Doses of PPSV23 are not needed for people immunized with PPSV23 at or after age 33 years.  Meningococcal vaccine. Adults with asplenia or persistent complement  component deficiencies should receive 2  doses of quadrivalent meningococcal conjugate (MenACWY-D) vaccine. The doses should be obtained at least 2 months apart. Microbiologists working with certain meningococcal bacteria, [place] recruits, people at risk during an outbreak, and people who travel to or live in countries with a high rate of meningitis should be immunized. A first-year college student up through age 49 years who is living in a residence hall should receive a dose if she did not receive a dose on or after her 16th birthday. Adults who have certain high-risk conditions should receive one or more doses of vaccine.  Hepatitis A vaccine. Adults who wish to be protected from this disease, have certain high-risk conditions, work with hepatitis A-infected animals, work in hepatitis A research labs, or travel to or work in countries with a high rate of hepatitis A should be immunized. Adults who were previously unvaccinated and who anticipate close contact with an international adoptee during the first 60 days after arrival in the [place] from a country with a high rate of hepatitis A should be immunized.  Hepatitis B vaccine. Adults who wish to be protected from this disease, have certain high-risk conditions, may be exposed to blood or other infectious body fluids, are household contacts or sex partners of hepatitis B positive people, are clients or workers in certain care facilities, or travel to or work in countries with a high rate of hepatitis B should be immunized.  Haemophilus influenzae type b (Hib) vaccine. A previously unvaccinated person with asplenia or sickle cell disease or having a scheduled splenectomy should receive 1 dose of Hib vaccine. Regardless of previous immunization, a recipient of a hematopoietic stem cell transplant should receive a 3-dose series 6 12 months after her successful transplant. Hib vaccine is not recommended for adults with HIV infection. Preventive  Services / Frequency Ages 24 to 39years  Blood pressure check.** / Every 1 to 2 years.  Lipid and cholesterol check.** / Every 5 years beginning at age 66.  Clinical breast exam.** / Every 3 years for women in their 12s and 24s.  BRCA-related cancer risk assessment.** / For women who have family members with a BRCA-related cancer (breast, ovarian, tubal, or peritoneal cancers).  Pap test.** / Every 2 years from ages 31 through 69. Every 3 years starting at age 64 through age 76 or 89 with a history of 3 consecutive normal Pap tests.  HPV screening.** / Every 3 years from ages 10 through ages 10 to 96 with a history of 3 consecutive normal Pap tests.  Hepatitis C blood test.** / For any individual with known risks for hepatitis C.  Skin self-exam. / Monthly.  Influenza vaccine. / Every year.  Tetanus, diphtheria, and acellular pertussis (Tdap, Td) vaccine.** / Consult your health care provider. Pregnant women should receive 1 dose of Tdap vaccine during each pregnancy. 1 dose of Td every 10 years.  Varicella vaccine.** / Consult your health care provider. Pregnant females who do not have evidence of immunity should receive the first dose after pregnancy.  HPV vaccine. / 3 doses over 6 months, if 90 and younger. The vaccine is not recommended for use in pregnant females. However, pregnancy testing is not needed before receiving a dose.  Measles, mumps, rubella (MMR) vaccine.** / You need at least 1 dose of MMR if you were born in 1957 or later. You may also need a 2nd dose. For females of childbearing age, rubella immunity should be determined. If there is no evidence of immunity, females who are not  pregnant should be vaccinated. If there is no evidence of immunity, females who are pregnant should delay immunization until after pregnancy.  Pneumococcal 13-valent conjugate (PCV13) vaccine.** / Consult your health care provider.  Pneumococcal polysaccharide (PPSV23) vaccine.** / 1 to 2  doses if you smoke cigarettes or if you have certain conditions.  Meningococcal vaccine.** / 1 dose if you are age 88 to 6 years and a Market researcher living in a residence hall, or have one of several medical conditions, you need to get vaccinated against meningococcal disease. You may also need additional booster doses.  Hepatitis A vaccine.** / Consult your health care provider.  Hepatitis B vaccine.** / Consult your health care provider.  Haemophilus influenzae type b (Hib) vaccine.** / Consult your health care provider. Ages 23 to 64years  Blood pressure check.** / Every 1 to 2 years.  Lipid and cholesterol check.** / Every 5 years beginning at age 20 years.  Lung cancer screening. / Every year if you are aged 51 80 years and have a 30-pack-year history of smoking and currently smoke or have quit within the past 15 years. Yearly screening is stopped once you have quit smoking for at least 15 years or develop a health problem that would prevent you from having lung cancer treatment.  Clinical breast exam.** / Every year after age 8 years.  BRCA-related cancer risk assessment.** / For women who have family members with a BRCA-related cancer (breast, ovarian, tubal, or peritoneal cancers).  Mammogram.** / Every year beginning at age 10 years and continuing for as long as you are in good health. Consult with your health care provider.  Pap test.** / Every 3 years starting at age 30 years through age 5 or 61 years with a history of 3 consecutive normal Pap tests.  HPV screening.** / Every 3 years from ages 39 years through ages 72 to 19 years with a history of 3 consecutive normal Pap tests.  Fecal occult blood test (FOBT) of stool. / Every year beginning at age 59 years and continuing until age 27 years. You may not need to do this test if you get a colonoscopy every 10 years.  Flexible sigmoidoscopy or colonoscopy.** / Every 5 years for a flexible sigmoidoscopy or every  10 years for a colonoscopy beginning at age 110 years and continuing until age 63 years.  Hepatitis C blood test.** / For all people born from [birth year] through 1964 and any individual with known risks for hepatitis C.  Skin self-exam. / Monthly.  Influenza vaccine. / Every year.  Tetanus, diphtheria, and acellular pertussis (Tdap/Td) vaccine.** / Consult your health care provider. Pregnant women should receive 1 dose of Tdap vaccine during each pregnancy. 1 dose of Td every 10 years.  Varicella vaccine.** / Consult your health care provider. Pregnant females who do not have evidence of immunity should receive the first dose after pregnancy.  Zoster vaccine.** / 1 dose for adults aged 46 years or older.  Measles, mumps, rubella (MMR) vaccine.** / You need at least 1 dose of MMR if you were born in 1956 or later. You may also need a 2nd dose. For females of childbearing age, rubella immunity should be determined. If there is no evidence of immunity, females who are not pregnant should be vaccinated. If there is no evidence of immunity, females who are pregnant should delay immunization until after pregnancy.  Pneumococcal 13-valent conjugate (PCV13) vaccine.** / Consult your health care provider.  Pneumococcal polysaccharide (PPSV23) vaccine.** / 1  to 2 doses if you smoke cigarettes or if you have certain conditions.  Meningococcal vaccine.** / Consult your health care provider.  Hepatitis A vaccine.** / Consult your health care provider.  Hepatitis B vaccine.** / Consult your health care provider.  Haemophilus influenzae type b (Hib) vaccine.** / Consult your health care provider. Ages 34 years and over  Blood pressure check.** / Every 1 to 2 years.  Lipid and cholesterol check.** / Every 5 years beginning at age 58 years.  Lung cancer screening. / Every year if you are aged 21 80 years and have a 30-pack-year history of smoking and currently smoke or have quit within the past 15 years.  Yearly screening is stopped once you have quit smoking for at least 15 years or develop a health problem that would prevent you from having lung cancer treatment.  Clinical breast exam.** / Every year after age 57 years.  BRCA-related cancer risk assessment.** / For women who have family members with a BRCA-related cancer (breast, ovarian, tubal, or peritoneal cancers).  Mammogram.** / Every year beginning at age 30 years and continuing for as long as you are in good health. Consult with your health care provider.  Pap test.** / Every 3 years starting at age 7 years through age 26 or 27 years with 3 consecutive normal Pap tests. Testing can be stopped between 65 and 70 years with 3 consecutive normal Pap tests and no abnormal Pap or HPV tests in the past 10 years.  HPV screening.** / Every 3 years from ages 72 years through ages 65 or 27 years with a history of 3 consecutive normal Pap tests. Testing can be stopped between 65 and 70 years with 3 consecutive normal Pap tests and no abnormal Pap or HPV tests in the past 10 years.  Fecal occult blood test (FOBT) of stool. / Every year beginning at age 19 years and continuing until age 67 years. You may not need to do this test if you get a colonoscopy every 10 years.  Flexible sigmoidoscopy or colonoscopy.** / Every 5 years for a flexible sigmoidoscopy or every 10 years for a colonoscopy beginning at age 81 years and continuing until age 76 years.  Hepatitis C blood test.** / For all people born from [birth year] through 1964 and any individual with known risks for hepatitis C.  Osteoporosis screening.** / A one-time screening for women ages 31 years and over and women at risk for fractures or osteoporosis.  Skin self-exam. / Monthly.  Influenza vaccine. / Every year.  Tetanus, diphtheria, and acellular pertussis (Tdap/Td) vaccine.** / 1 dose of Td every 10 years.  Varicella vaccine.** / Consult your health care provider.  Zoster vaccine.** / 1  dose for adults aged 31 years or older.  Pneumococcal 13-valent conjugate (PCV13) vaccine.** / Consult your health care provider.  Pneumococcal polysaccharide (PPSV23) vaccine.** / 1 dose for all adults aged 38 years and older.  Meningococcal vaccine.** / Consult your health care provider.  Hepatitis A vaccine.** / Consult your health care provider.  Hepatitis B vaccine.** / Consult your health care provider.  Haemophilus influenzae type b (Hib) vaccine.** / Consult your health care provider. ** Family history and personal history of risk and conditions may change your health care provider's recommendations. Document Released: 01/03/2002 Document Revised: 08/28/2013 Document Reviewed: 04/04/2011 [facility] Patient Information 2014 [place], [place]. Oral Contraception Information Oral contraceptive pills (OCPs) are medicines taken to prevent pregnancy. OCPs work by preventing the ovaries from releasing eggs. The hormones in  OCPs also cause the cervical mucus to thicken, preventing the sperm from entering the uterus. The hormones also cause the uterine lining to become thin, not allowing a fertilized egg to attach to the inside of the uterus. OCPs are highly effective when taken exactly as prescribed. However, OCPs do not prevent sexually transmitted diseases (STDs). Safe sex practices, such as using condoms along with the pill, can help prevent STDs.  Before taking the pill, you may have a physical exam and Pap test. Your health care provider may order blood tests. The health care provider will make sure you are a good candidate for oral contraception. Discuss with your health care provider the possible side effects of the OCP you may be prescribed. When starting an OCP, it can take 2 to 3 months for the body to adjust to the changes in hormone levels in your body.  TYPES OF ORAL CONTRACEPTION  The combination pill This pill contains estrogen and progestin (synthetic progesterone) hormones. The  combination pill comes in 21-day, 28-day, or 91-day packs. Some types of combination pills are meant to be taken continuously (365-day pills). With 21-day packs, you do not take pills for 7 days after the last pill. With 28-day packs, the pill is taken every day. The last 7 pills are without hormones. Certain types of pills have more than 21 hormone-containing pills. With 91-day packs, the first 84 pills contain both hormones, and the last 7 pills contain no hormones or contain estrogen only.  The minipill This pill contains the progesterone hormone only. The pill is taken every day continuously. It is very important to take the pill at the same time each day. The minipill comes in packs of 28 pills. All 28 pills contain the hormone.  ADVANTAGES OF ORAL CONTRACEPTIVE PILLS  Decreases premenstrual symptoms.   Treats menstrual period cramps.   Regulates the menstrual cycle.   Decreases a heavy menstrual flow.   May treatacne, depending on the type of pill.   Treats abnormal uterine bleeding.   Treats polycystic ovarian syndrome.   Treats endometriosis.   Can be used as emergency contraception.  THINGS THAT CAN MAKE ORAL CONTRACEPTIVE PILLS LESS EFFECTIVE OCPs can be less effective if:   You forget to take the pill at the same time every day.   You have a stomach or intestinal disease that lessens the absorption of the pill.   You take OCPs with other medicines that make OCPs less effective, such as antibiotics, certain HIV medicines, and some seizure medicines.   You take expired OCPs.   You forget to restart the pill on day 7, when using the packs of 21 pills.  RISKS ASSOCIATED WITH ORAL CONTRACEPTIVE PILLS  Oral contraceptive pills can sometimes cause side effects, such as:  Headache.  Nausea.  Breast tenderness.  Irregular bleeding or spotting. Combination pills are also associated with a small increased risk of:  Blood clots.  Heart  attack.  Stroke. Document Released: 01/28/2003 Document Revised: 08/28/2013 Document Reviewed: 04/28/2013 [facility] Patient Information 2014 [place], [place].

## 2014-05-07 LAB — CYTOLOGY - PAP

## 2014-09-22 ENCOUNTER — Encounter: Payer: Self-pay | Admitting: Family Medicine

## 2015-05-10 ENCOUNTER — Other Ambulatory Visit: Payer: Self-pay | Admitting: Family Medicine

## 2015-09-20 IMAGING — US US OB COMP LESS 14 WK
1 series · 13 of 25 positions shown · non-contrast
Comparison: none

[Series 1: us ob comp less 14 wk · 0.15mm/px · 13 of 25 slices shown]
[im 1/25]
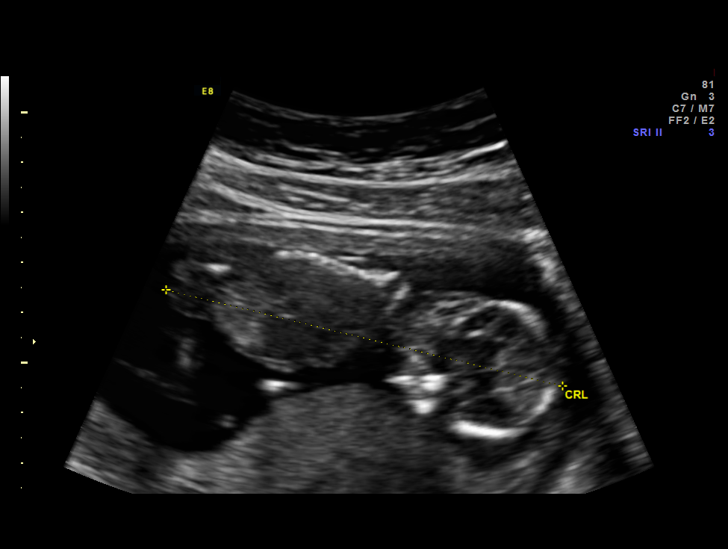
[im 3/25]
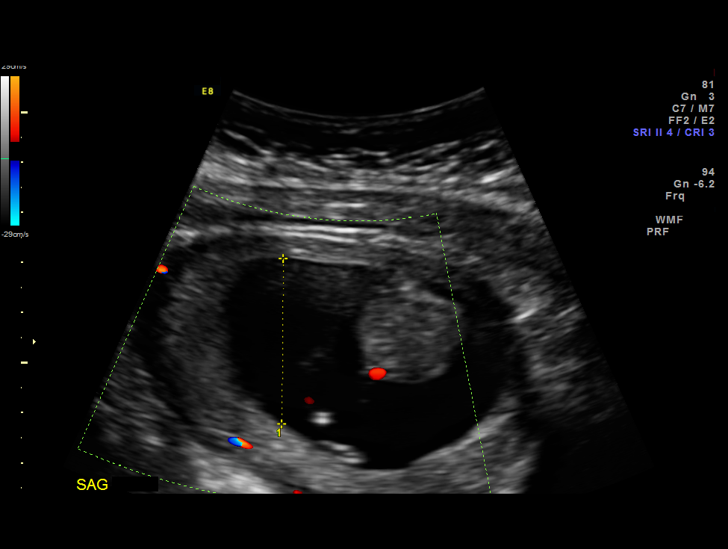
[im 5/25]
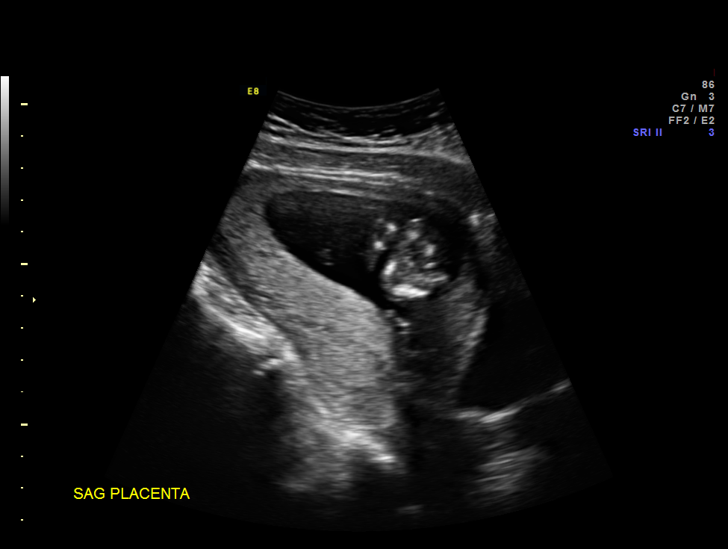
[im 7/25]
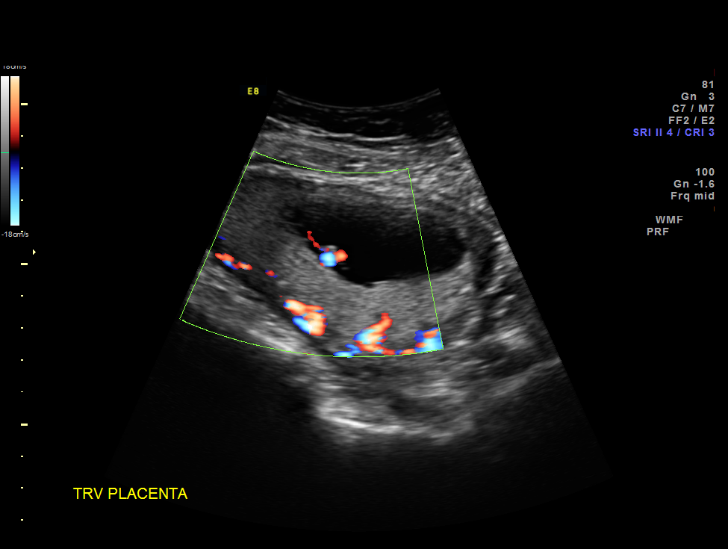
[im 9/25]
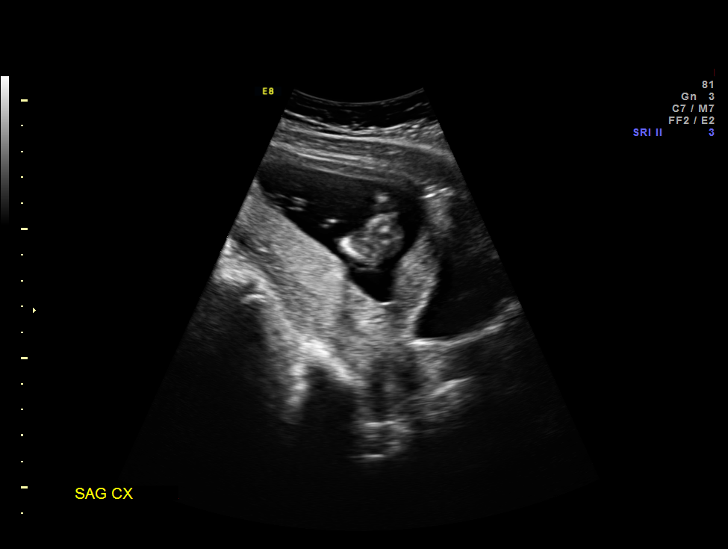
[im 11/25]
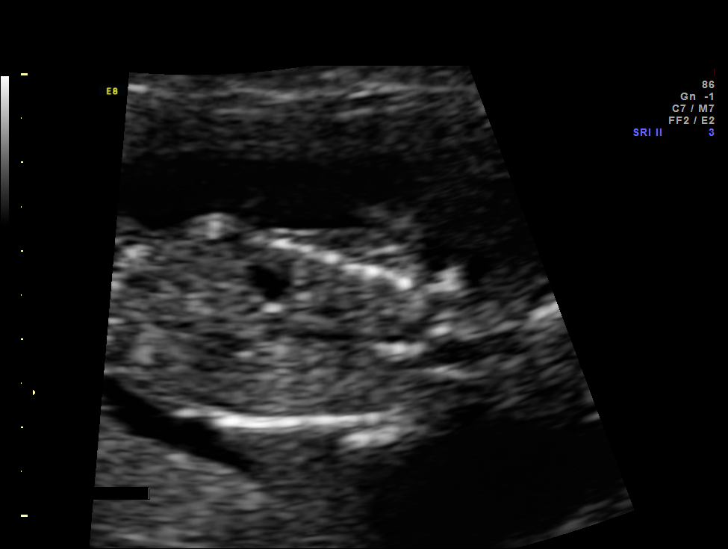
[im 13/25]
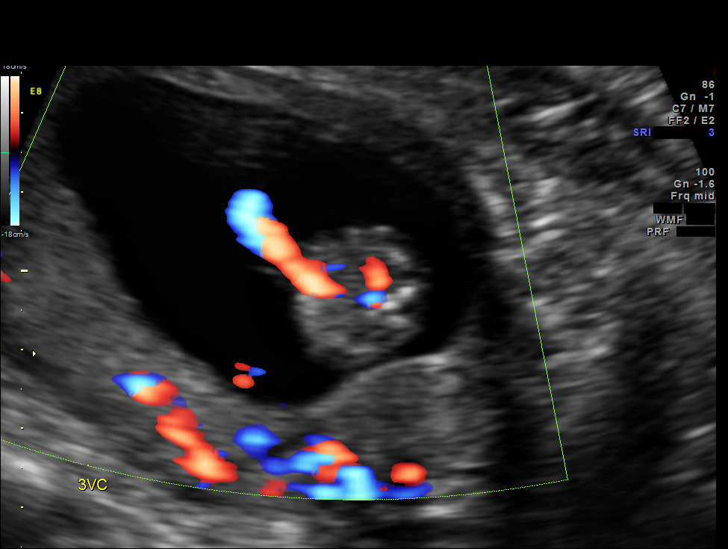
[im 15/25]
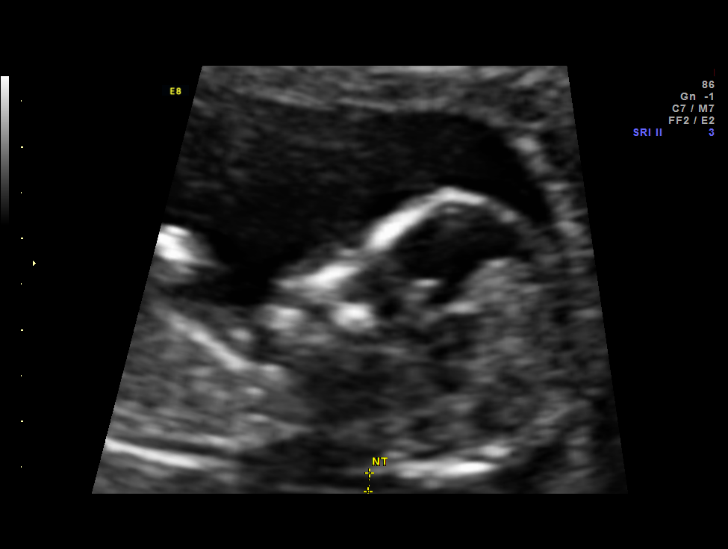
[im 17/25]
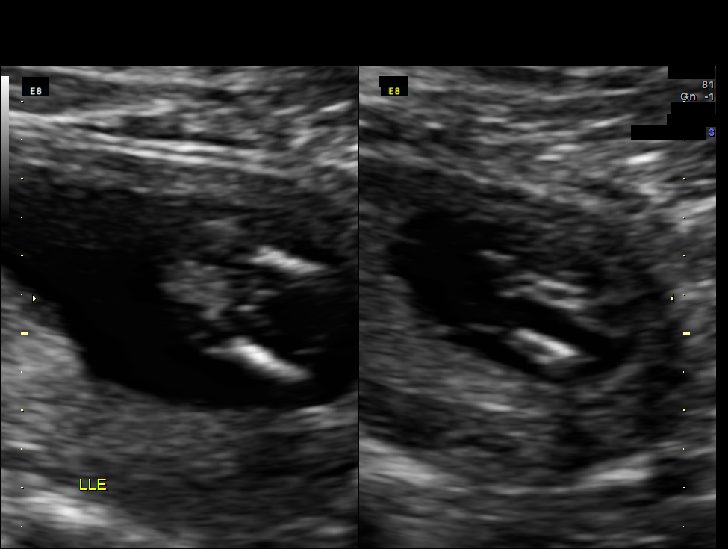
[im 19/25]
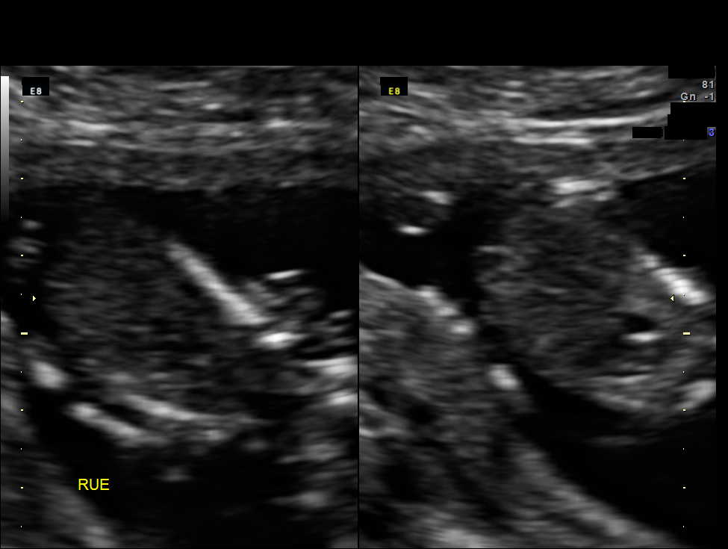
[im 21/25]
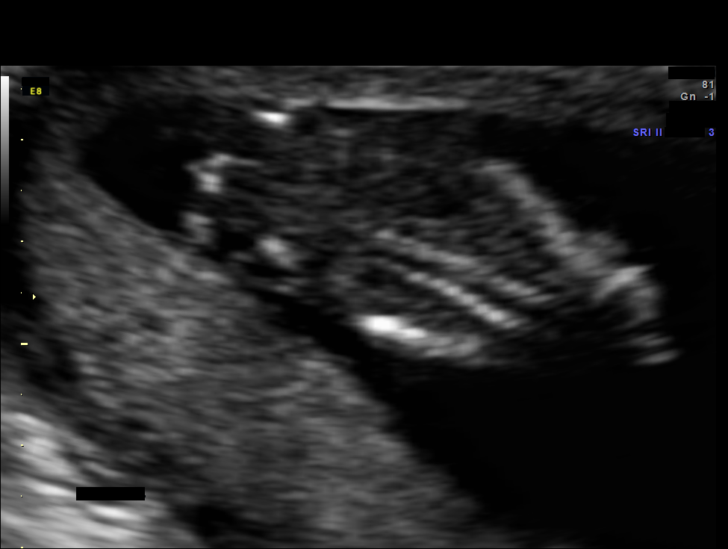
[im 23/25]
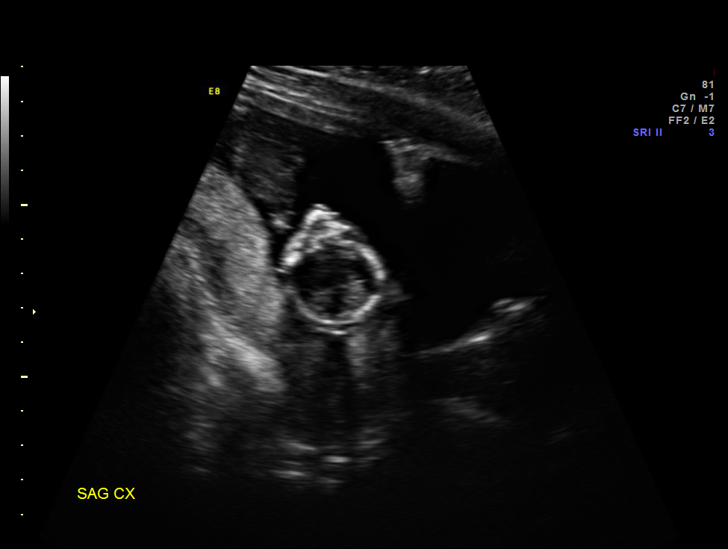
[im 25/25]
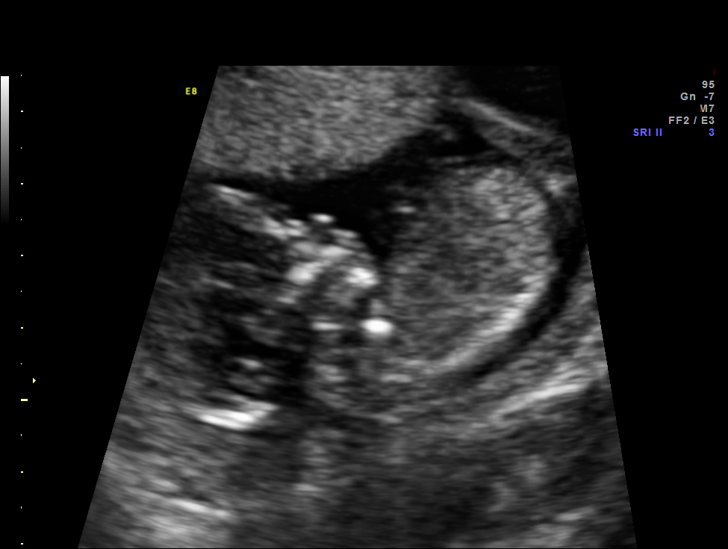

[13 of 25 positions shown; findings below may reference images not displayed]

OBSTETRICS REPORT
                      (Signed Final 10/04/2013 [DATE])

Service(s) Provided

 US OB COMP LESS 14 WKS                                76801.0
Indications

 First trimester aneuploidy screen (NT)
Fetal Evaluation

 Num Of Fetuses:    1
 Fetal Heart Rate:  152                          bpm
 Cardiac Activity:  Observed
 Presentation:      Variable
 Placenta:          Posterior
 P. Cord            Visualized
 Insertion:

 Amniotic Fluid
 AFI FV:      Subjectively within normal limits
                                             Larg Pckt:     3.3  cm
Biometry

 CRL:     79.6  mm     G. Age:  13w 5d                 EDD:    04/06/14
 NT:       2.0  mm
Gestational Age

 LMP:           14w 6d        Date:  06/22/13                 EDD:   03/29/14
 Best:          13w 5d     Det. By:  U/S C R L (10/04/13)     EDD:   04/06/14
Anatomy

 Choroid Plexus:   Appears normal         Bladder:          Appears normal
 Stomach:          Previously Seen        Lower             Visualized
                                          Extremities:
 Cord Vessels:     Appears normal (3      Upper             Visualized
                   vessel cord)           Extremities:
 Kidneys:          Appear normal

 Other:  Nasal bone visualized.
Cervix Uterus Adnexa
 Cervix:       Closed
Impression

 IUP at 13+5 weeks
 No gross abnormalities identified
 NT measurement could not be obtained today; NB present
 Normal amniotic fluid volume
 EDC based on today's measurements
Recommendations

 Quad screen 15-20 weeks if still desires aneuploidy screening

 questions or concerns.

## 2016-04-20 ENCOUNTER — Other Ambulatory Visit: Payer: Self-pay | Admitting: Family Medicine

## 2016-05-26 ENCOUNTER — Ambulatory Visit: Payer: Medicaid Other | Admitting: Obstetrics and Gynecology

## 2016-06-07 ENCOUNTER — Encounter: Payer: Self-pay | Admitting: Obstetrics & Gynecology

## 2016-06-07 ENCOUNTER — Ambulatory Visit (INDEPENDENT_AMBULATORY_CARE_PROVIDER_SITE_OTHER): Payer: Medicaid Other | Admitting: Obstetrics & Gynecology

## 2016-06-07 VITALS — BP 109/70 | HR 61 | Resp 18 | Ht 61.0 in | Wt 174.0 lb

## 2016-06-07 DIAGNOSIS — E669 Obesity, unspecified: Secondary | ICD-10-CM

## 2016-06-07 DIAGNOSIS — R300 Dysuria: Secondary | ICD-10-CM | POA: Diagnosis not present

## 2016-06-07 DIAGNOSIS — Z01419 Encounter for gynecological examination (general) (routine) without abnormal findings: Secondary | ICD-10-CM

## 2016-06-07 DIAGNOSIS — Z3041 Encounter for surveillance of contraceptive pills: Secondary | ICD-10-CM | POA: Diagnosis not present

## 2016-06-07 LAB — POCT URINALYSIS DIPSTICK
BILIRUBIN UA: NEGATIVE
Blood, UA: NEGATIVE
Glucose, UA: NEGATIVE
Ketones, UA: NEGATIVE
Nitrite, UA: NEGATIVE
SPEC GRAV UA: 1.025
Urobilinogen, UA: 0.2
pH, UA: 5

## 2016-06-07 NOTE — Progress Notes (Signed)
Patient ID: Amber Wilkins, female   DOB: 27-Nov-1990, 25 y.o.   MRN: [id number]  Chief Complaint  Patient presents with  . Gynecologic Exam  recently has dysuria discharge odor and spotting  HPI Amber Wilkins is a 25 y.o. female.  G1P1001 Patient's last menstrual period was 05/22/2016. Regular menses on OCP and wants to continue. Pap normal 04/2014  HPI  Past Medical History  Diagnosis Date  . Seasonal allergies   . Bronchitis, allergic     Past Surgical History  Procedure Laterality Date  . Appendectomy  2002    Family History  Problem Relation Age of Onset  . Seizures Mother   . Ovarian cancer Mother   . Cervical cancer Mother   . Lupus Mother   . Kidney disease Mother   . Hypertension Father   . Hypertension Paternal Grandmother   . Stroke Paternal Grandfather   . Hypertension Paternal Grandfather   . Heart disease Maternal Grandmother     Social History Social History  Substance Use Topics  . Smoking status: Current Some Day Smoker -- 0.00 packs/day for 1 years    Types: Cigarettes    Last Attempt to Quit: 07/12/2013  . Smokeless tobacco: None  . Alcohol Use: No    Allergies  Allergen Reactions  . Penicillins Hives and Nausea And Vomiting    Current Outpatient Prescriptions  Medication Sig Dispense Refill  . loratadine (CLARITIN) 10 MG tablet Take 10 mg by mouth daily.    . SPRINTEC 28 0.25-35 MG-MCG tablet TAKE 1 TABLET BY MOUTH DAILY. 28 tablet 11   No current facility-administered medications for this visit.    Review of Systems Review of Systems  Constitutional: Negative.   Gastrointestinal: Negative.   Endocrine: Negative.   Genitourinary: Positive for dysuria and vaginal discharge. Negative for menstrual problem and pelvic pain.    Blood pressure 109/70, pulse 61, resp. rate 18, height 5\' 1"  (1.549 m), weight 174 lb (78.926 kg), last menstrual period 05/22/2016.  Physical Exam Physical Exam  Constitutional: She is oriented to person,  place, and time. She appears well-developed. No distress.  Neck: No thyromegaly present.  Cardiovascular: Normal rate.   Pulmonary/Chest: Effort normal. No respiratory distress.  Breasts: breasts appear normal, no suspicious masses, no skin or nipple changes or axillary nodes.   Abdominal: Soft.  Genitourinary: Vaginal discharge found.  Pelvic exam: VULVA: normal appearing vulva with no masses, tenderness or lesions, VAGINA: vaginal discharge - white, CERVIX: normal appearing cervix without discharge or lesions, UTERUS: uterus is normal size, shape, consistency and nontender, ADNEXA: normal adnexa in size, nontender and no masses.   Neurological: She is alert and oriented to person, place, and time.  Skin: Skin is warm and dry.  Psychiatric: She has a normal mood and affect. Her behavior is normal.  Vitals reviewed.   Data Reviewed Pap result and UA  Assessment    Possible UTI, vaginitis testing pending   Normal well woman exam, pap is up to date  Plan    Continue present OCP, f/u on urine culture and BD affirm, notify of result        Azazel Franze 06/07/2016, 11:07 AM

## 2016-06-07 NOTE — Patient Instructions (Signed)
Oral Contraception Use Oral contraceptive pills (OCPs) are medicines taken to prevent pregnancy. OCPs work by preventing the ovaries from releasing eggs. The hormones in OCPs also cause the cervical mucus to thicken, preventing the sperm from entering the uterus. The hormones also cause the uterine lining to become thin, not allowing a fertilized egg to attach to the inside of the uterus. OCPs are highly effective when taken exactly as prescribed. However, OCPs do not prevent sexually transmitted diseases (STDs). Safe sex practices, such as using condoms along with an OCP, can help prevent STDs. Before taking OCPs, you may have a physical exam and Pap test. Your health care provider may also order blood tests if necessary. Your health care provider will make sure you are a good candidate for oral contraception. Discuss with your health care provider the possible side effects of the OCP you may be prescribed. When starting an OCP, it can take 2 to 3 months for the body to adjust to the changes in hormone levels in your body.  HOW TO TAKE ORAL CONTRACEPTIVE PILLS Your health care provider may advise you on how to start taking the first cycle of OCPs. Otherwise, you can:   Start on day 1 of your menstrual period. You will not need any backup contraceptive protection with this start time.   Start on the first Sunday after your menstrual period or the day you get your prescription. In these cases, you will need to use backup contraceptive protection for the first week.   Start the pill at any time of your cycle. If you take the pill within 5 days of the start of your period, you are protected against pregnancy right away. In this case, you will not need a backup form of birth control. If you start at any other time of your menstrual cycle, you will need to use another form of birth control for 7 days. If your OCP is the type called a minipill, it will protect you from pregnancy after taking it for 2 days (48  hours). After you have started taking OCPs:   If you forget to take 1 pill, take it as soon as you remember. Take the next pill at the regular time.   If you miss 2 or more pills, call your health care provider because different pills have different instructions for missed doses. Use backup birth control until your next menstrual period starts.   If you use a 28-day pack that contains inactive pills and you miss 1 of the last 7 pills (pills with no hormones), it will not matter. Throw away the rest of the non-hormone pills and start a new pill pack.  No matter which day you start the OCP, you will always start a new pack on that same day of the week. Have an extra pack of OCPs and a backup contraceptive method available in case you miss some pills or lose your OCP pack.  HOME CARE INSTRUCTIONS   Do not smoke.   Always use a condom to protect against STDs. OCPs do not protect against STDs.   Use a calendar to mark your menstrual period days.   Read the information and directions that came with your OCP. Talk to your health care provider if you have questions.  SEEK MEDICAL CARE IF:   You develop nausea and vomiting.   You have abnormal vaginal discharge or bleeding.   You develop a rash.   You miss your menstrual period.   You are losing   your hair.   You need treatment for mood swings or depression.   You get dizzy when taking the OCP.   You develop acne from taking the OCP.   You become pregnant.  SEEK IMMEDIATE MEDICAL CARE IF:   You develop chest pain.   You develop shortness of breath.   You have an uncontrolled or severe headache.   You develop numbness or slurred speech.   You develop visual problems.   You develop pain, redness, and swelling in the legs.    This information is not intended to replace advice given to you by your health care provider. Make sure you discuss any questions you have with your health care provider.   Document  Released: 10/27/2011 Document Revised: 11/28/2014 Document Reviewed: 04/28/2013 Elsevier Interactive Patient Education 2016 [employer]  

## 2016-06-07 NOTE — Progress Notes (Signed)
Pt c/o vaginal odor and occasional burning with urination.

## 2016-06-08 LAB — WET PREP BY MOLECULAR PROBE
Candida species: NEGATIVE
GARDNERELLA VAGINALIS: POSITIVE — AB
TRICHOMONAS VAG: NEGATIVE

## 2016-06-09 LAB — URINE CULTURE: ORGANISM ID, BACTERIA: NO GROWTH

## 2016-06-13 ENCOUNTER — Telehealth: Payer: Self-pay | Admitting: *Deleted

## 2016-06-13 DIAGNOSIS — B9689 Other specified bacterial agents as the cause of diseases classified elsewhere: Secondary | ICD-10-CM

## 2016-06-13 DIAGNOSIS — N760 Acute vaginitis: Principal | ICD-10-CM

## 2016-06-13 MED ORDER — METRONIDAZOLE 500 MG PO TABS: 500.0000 mg | ORAL_TABLET | Freq: Two times a day (BID) | ORAL | 0 refills | Status: DC

## 2016-06-13 NOTE — Telephone Encounter (Signed)
-----   Message from James G Arnold, MD sent at 06/10/2016  1:42 PM EDT ----- Recommend flagyl 500 BID 7 days for BV 

## 2016-06-13 NOTE — Telephone Encounter (Signed)
Called pt, no answer, left message to call the office.  

## 2016-06-13 NOTE — Telephone Encounter (Signed)
Pt returned call, informed her of result and sent Flagyl to pharmacy per order.  Instructed pt on medication use.

## 2016-06-13 NOTE — Telephone Encounter (Signed)
-----   Message from Adam Phenix, MD sent at 06/10/2016  1:42 PM EDT ----- Recommend flagyl 500 BID 7 days for BV

## 2017-04-04 ENCOUNTER — Other Ambulatory Visit: Payer: Self-pay | Admitting: Family Medicine

## 2017-08-07 ENCOUNTER — Emergency Department: Payer: Self-pay

## 2017-08-07 ENCOUNTER — Emergency Department
Admission: EM | Admit: 2017-08-07 | Discharge: 2017-08-07 | Disposition: A | Discharge status: Home / Self Care | Payer: Self-pay | Attending: Emergency Medicine | Admitting: Emergency Medicine

## 2017-08-07 DIAGNOSIS — Z79899 Other long term (current) drug therapy: Secondary | ICD-10-CM | POA: Insufficient documentation

## 2017-08-07 DIAGNOSIS — F17210 Nicotine dependence, cigarettes, uncomplicated: Secondary | ICD-10-CM | POA: Insufficient documentation

## 2017-08-07 DIAGNOSIS — R059 Cough, unspecified: Secondary | ICD-10-CM

## 2017-08-07 DIAGNOSIS — J00 Acute nasopharyngitis [common cold]: Secondary | ICD-10-CM | POA: Insufficient documentation

## 2017-08-07 DIAGNOSIS — R05 Cough: Secondary | ICD-10-CM | POA: Insufficient documentation

## 2017-08-07 MED ORDER — BENZONATATE 100 MG PO CAPS: 100.0000 mg | ORAL_CAPSULE | Freq: Three times a day (TID) | ORAL | 0 refills | Status: DC | PRN

## 2017-08-07 MED ORDER — AZITHROMYCIN 250 MG PO TABS: ORAL_TABLET | ORAL | 0 refills | Status: AC

## 2017-08-07 NOTE — ED Notes (Signed)
Se triage note  Presents with cough and some chest congestion which started over the weekend  Afebrile at present  States cough has been prod at times

## 2017-08-07 NOTE — ED Triage Notes (Signed)
Pt presents to ED for cough and congestion. Pt states symptoms since Friday. Productive cough. Pt states she took a decongest last night. States L CP. Unsure of fever. States she went to [employer] and took BP and it was 150/108. Pt is alert, oriented, coughing in triage.

## 2017-08-07 NOTE — ED Provider Notes (Signed)
[facility] Emergency Department Provider Note   ____________________________________________   I have reviewed the triage vital signs and the nursing notes.   HISTORY  Chief Complaint Cough    HPI Amber Wilkins is a 26 y.o. female presents to emergency department with cough, nasal congestion and rhinorrhea that began 3 days ago. Patient reports taking one dose of over-the-counter cough and congestion [employer] brand that experienced elevation in her blood pressure. Patient stopped taking the medication due to increased blood pressure and not feeling well related to the cough and cold medicine. Patient reports productive cough, nasal congestion, sinus pressure and congestion. Patient denies fever or earaches related to current symptoms. Patient denies chills, headache, vision changes, chest pain, chest tightness, shortness of breath, abdominal pain, nausea and vomiting.  Past Medical History:  Diagnosis Date  . Bronchitis, allergic   . Seasonal allergies     Patient Active Problem List   Diagnosis Date Noted  . Obesity, Class I, BMI 30.0-34.9 (see actual BMI) 06/07/2016  . Oral contraceptive use 06/07/2016    Past Surgical History:  Procedure Laterality Date  . APPENDECTOMY  2002    Prior to Admission medications   Medication Sig Start Date End Date Taking? Authorizing Provider  azithromycin (ZITHROMAX Z-PAK) 250 MG tablet Take 2 tablets (500 mg) on  Day 1,  followed by 1 tablet (250 mg) once daily on Days 2 through 5. 08/07/17 08/12/17  Little, Traci M, PA-C  benzonatate (TESSALON PERLES) 100 MG capsule Take 1 capsule (100 mg total) by mouth 3 (three) times daily as needed for cough. 08/07/17   Little, Traci M, PA-C  loratadine (CLARITIN) 10 MG tablet Take 10 mg by mouth daily.    [provider]  metroNIDAZOLE (FLAGYL) 500 MG tablet Take 1 tablet (500 mg total) by mouth 2 (two) times daily. 06/13/16   Adam Phenix, MD  SPRINTEC 28 0.25-35  MG-MCG tablet TAKE 1 TABLET BY MOUTH DAILY. 04/04/17   Reva Bores, MD    Allergies Penicillins  Family History  Problem Relation Age of Onset  . Seizures Mother   . Ovarian cancer Mother   . Cervical cancer Mother   . Lupus Mother   . Kidney disease Mother   . Hypertension Father   . Hypertension Paternal Grandmother   . Stroke Paternal Grandfather   . Hypertension Paternal Grandfather   . Heart disease Maternal Grandmother     Social History Social History  Substance Use Topics  . Smoking status: Current Some Day Smoker    Packs/day: 0.00    Years: 1.00    Types: Cigarettes    Last attempt to quit: 07/12/2013  . Smokeless tobacco: Not on file  . Alcohol use No    Review of Systems Constitutional: Negative for fever/chills Eyes: No visual changes. ENT:  Positive for sore throat and negative for difficulty swallowing. Nasal congestion and rhinorrhea. Cardiovascular: Denies chest pain. Respiratory: Positive for cough. Denies shortness of breath. Gastrointestinal: No abdominal pain.  No nausea, vomiting, diarrhea. Genitourinary: Negative for dysuria. Musculoskeletal: Negative for back pain. Skin: Negative for rash. Neurological: Negative for headaches.   ____________________________________________   PHYSICAL EXAM:  VITAL SIGNS: ED Triage Vitals  Enc Vitals Group     BP 08/07/17 1213 (!) 149/97     Pulse Rate 08/07/17 1213 91     Resp 08/07/17 1213 18     Temp 08/07/17 1213 98.6 F (37 C)     Temp Source 08/07/17 1213  Oral     SpO2 08/07/17 1213 95 %     Weight 08/07/17 1214 190 lb (86.2 kg)     Height 08/07/17 1214  (1.549 m)     Head Circumference --      Peak Flow --      Pain Score 08/07/17 1213 3     Pain Loc --      Pain Edu? --      Excl. in GC? --     Constitutional: Alert and oriented. Well appearing and in no acute distress.  Eyes: Conjunctivae are normal. PERRL. EOMI  Head: Normocephalic and atraumatic. ENT:      Ears: Canals  clear. TMs intact bilaterally.      Nose: Congestion/rhinnorhea. Sinus pressure.      Mouth/Throat: Mucous membranes are moist. Oropharynx nonerythematous or edematous. Tonsils bilaterally symmetrical. Uvula midline Neck:Supple. No thyromegaly. No stridor.  Cardiovascular: Normal rate, regular rhythm. Normal S1 and S2.  Good peripheral circulation. Respiratory: Nonproductive cough. Normal respiratory effort without tachypnea or retractions. Lungs CTAB. No wheezes/rales/rhonchi. Good air entry to the bases with no decreased or absent breath sounds. Hematological/Lymphatic/Immunological: No cervical lymphadenopathy. Cardiovascular: Normal rate, regular rhythm. Normal distal pulses. Gastrointestinal: Bowel sounds 4 quadrants. Soft and nontender to palpation. No guarding or rigidity. No palpable masses. No distention. No CVA tenderness. Musculoskeletal: Nontender with normal range of motion in all extremities. Neurologic: Normal speech and language.  Skin:  Skin is warm, dry and intact. No rash noted. Psychiatric: Mood and affect are normal. Speech and behavior are normal. Patient exhibits appropriate insight and judgement.  ____________________________________________   LABS (all labs ordered are listed, but only abnormal results are displayed)  Labs Reviewed - No data to display ____________________________________________  EKG None ____________________________________________  RADIOLOGY DG chest 2 view FINDINGS: The cardiac and mediastinal silhouettes are within normal limits.  The lungs are normally inflated. No airspace consolidation, pleural effusion, or pulmonary edema is identified. There is no pneumothorax.  No acute osseous abnormality identified.  IMPRESSION: No active cardiopulmonary disease. ____________________________________________   PROCEDURES  Procedure(s) performed: No    Critical Care performed:  no ____________________________________________   INITIAL IMPRESSION / ASSESSMENT AND PLAN / ED COURSE  Pertinent labs & imaging results that were available during my care of the patient were reviewed by me and considered in my medical decision making (see chart for details).   Patient presents to the emergency department nasal congestion, cough and rhinorrhea that began 3 days ago. Patient also experienced elevated blood pressure after taking one dose of over-the-counter cough and cold medicine. History, physical exam findings and imaging are reassuring symptoms are consistent with nasopharyngitis/upper respiratory infection. Vital signs are reassuring and blood pressure was slightly elevated however within patient's normal at this time. Patient will be prescribed azithromycin for antibiotic coverage Tessalon Perles for cough as needed. Recommend patient to utilize OTC cold and sinus for symptom management as needed. Advised patient to take over-the-counter cold and sinus for patient's with hypertension. Patient can consult with the pharmacist to advise her on the appropriate over-the-counter cough and cold medications. Patient informed of clinical course, understand medical decision-making process, and agree with plan. Patient was advised to follow up with her primary care provider to address elevated blood pressure and was also advised to return to the emergency department for symptoms that change or worsen.      ____________________________________________   FINAL CLINICAL IMPRESSION(S) / ED DIAGNOSES  Final diagnoses:  Cough  Acute nasopharyngitis  NEW MEDICATIONS STARTED DURING THIS VISIT:  Discharge Medication List as of 08/07/2017  2:43 PM    START taking these medications   Details  azithromycin (ZITHROMAX Z-PAK) 250 MG tablet Take 2 tablets (500 mg) on  Day 1,  followed by 1 tablet (250 mg) once daily on Days 2 through 5., Print    benzonatate (TESSALON PERLES) 100  MG capsule Take 1 capsule (100 mg total) by mouth 3 (three) times daily as needed for cough., Starting Mon 08/07/2017, Print         Note:  This document was prepared using Dragon voice recognition software and may include unintentional dictation errors.    Little, Karl Pock 08/07/17 Mariea Clonts, MD 08/10/17 [id number]

## 2017-08-07 NOTE — Discharge Instructions (Signed)
Take medication as prescribed. Return to emergency department if symptoms worsen and follow-up with PCP as needed.    I recommend avoiding cough and cold medicines that elevate her blood pressure. I would choose cough and cold medications that do not increased blood pressure. You may talk to the pharmacist for recommendations.

## 2019-07-24 IMAGING — CR DG CHEST 2V
1 series · 2 of 2 positions shown · non-contrast
Comparison: None.

CLINICAL DATA: Initial evaluation for acute cough, congestion.

EXAM:
CHEST  2 VIEW

[Series 1: w chest pa · 0.14mm/px · 2 of 2 slices shown]
[im 1/2]
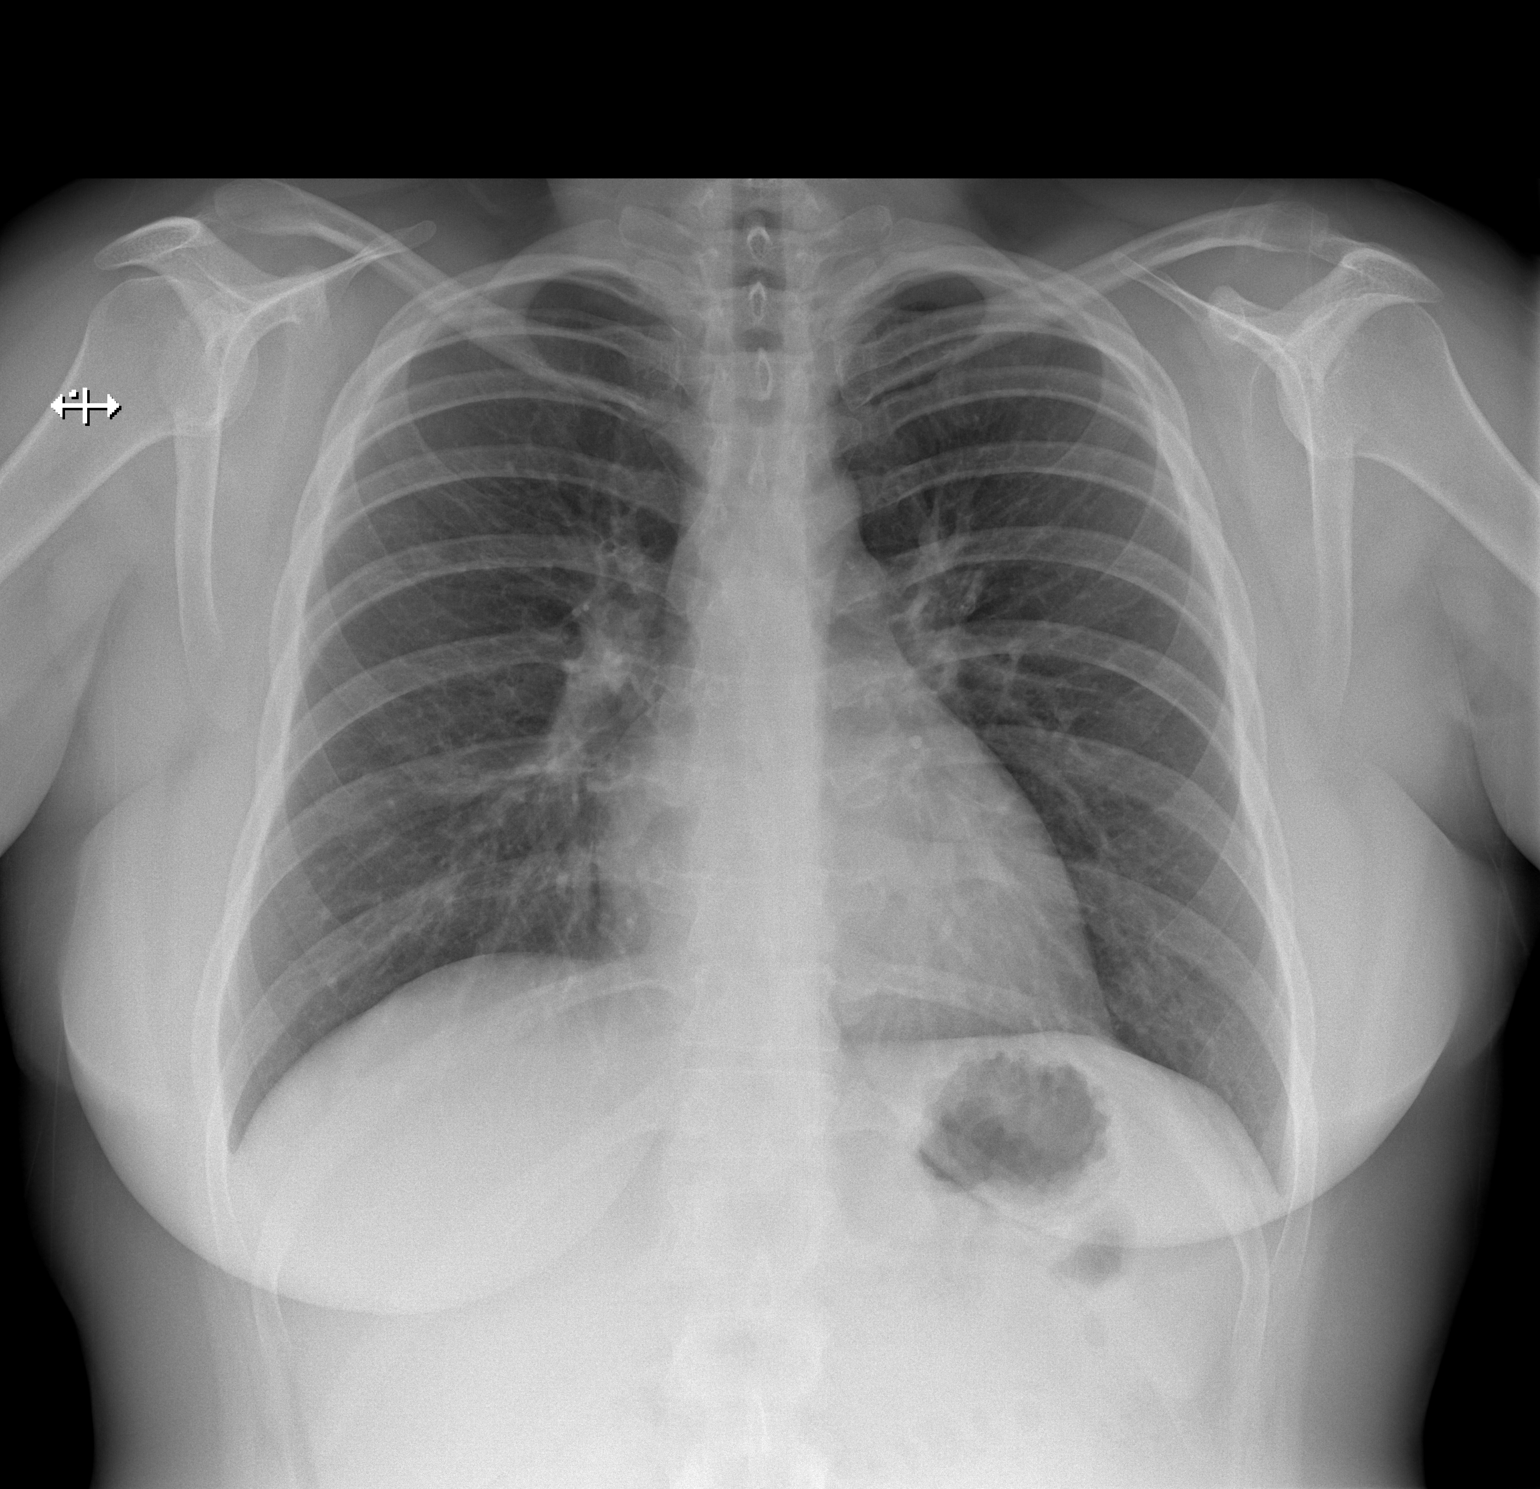
[im 2/2]
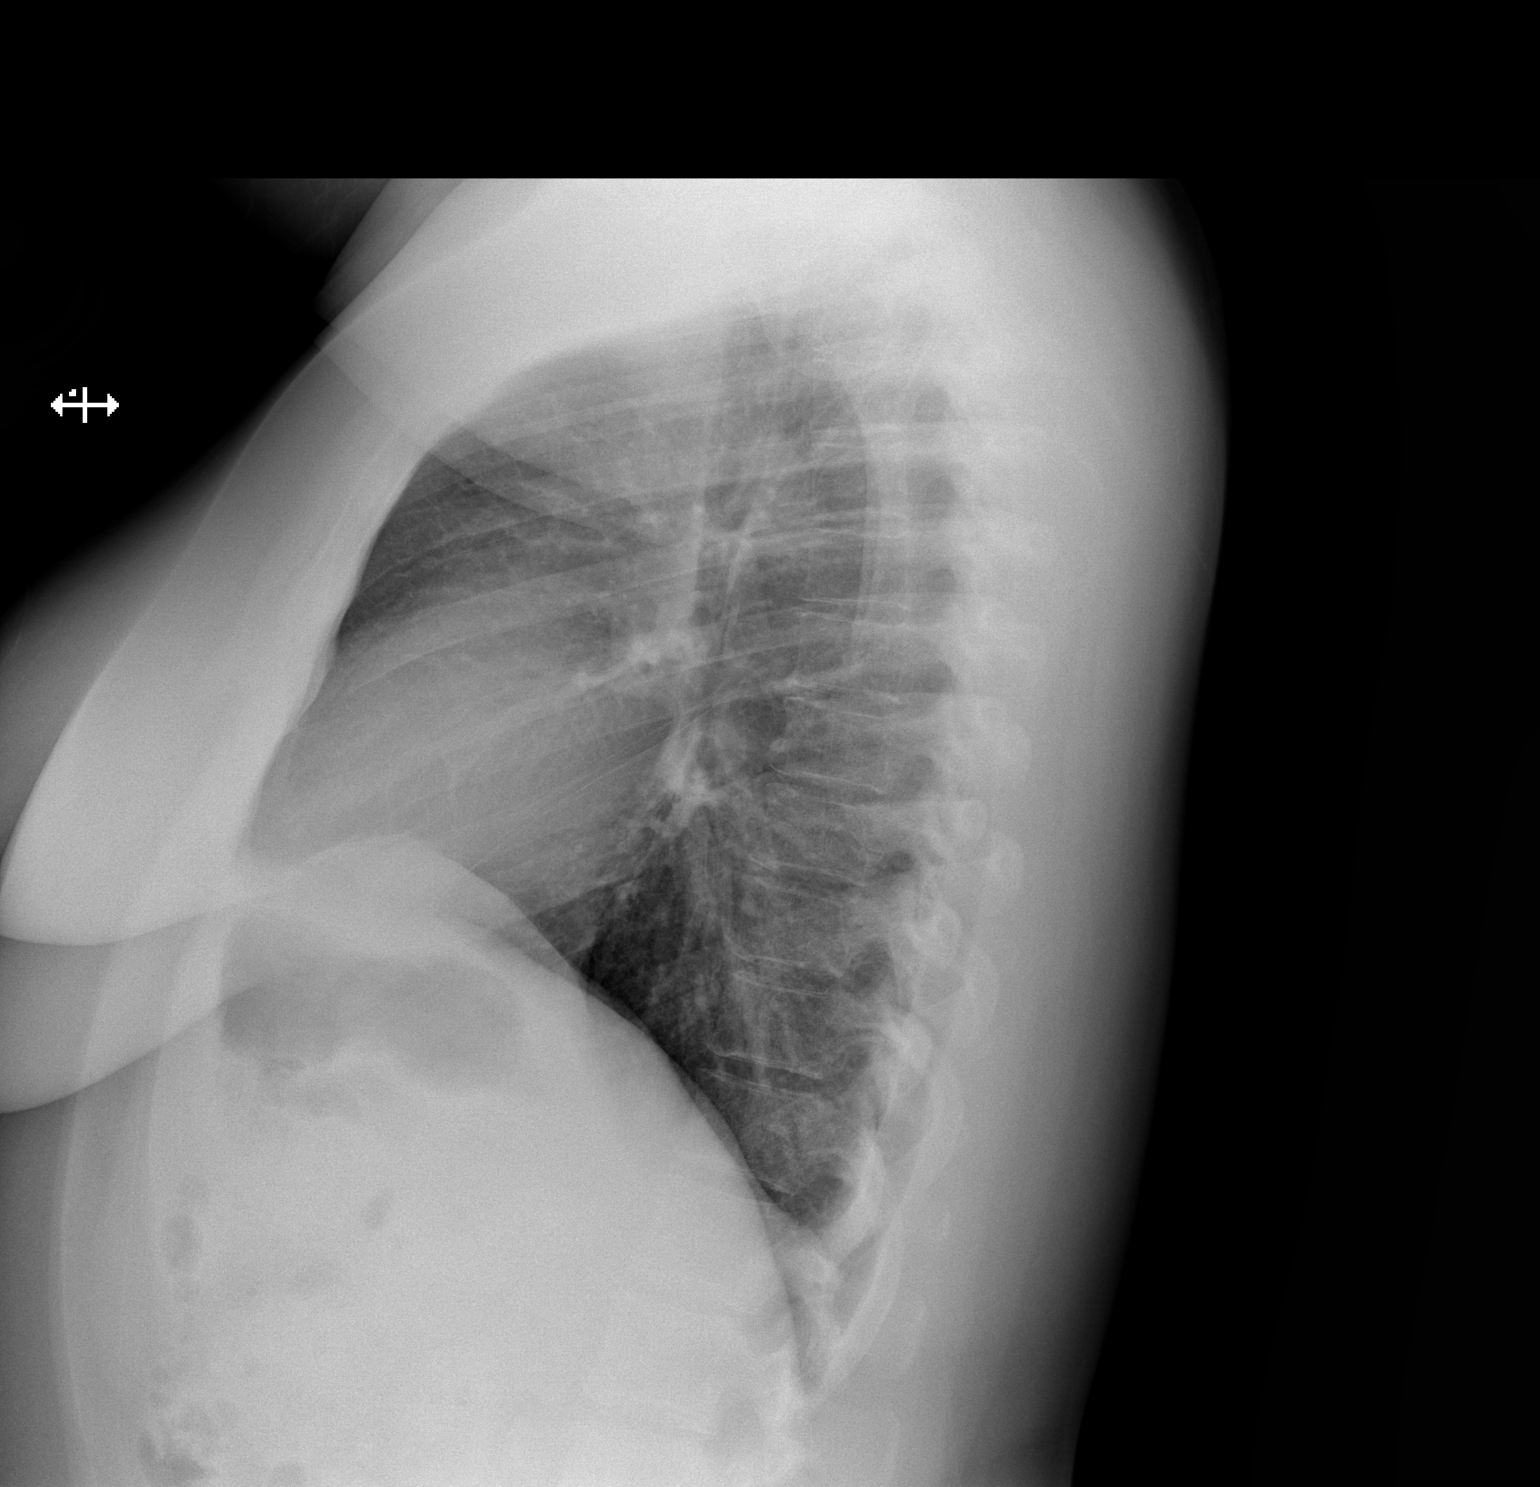

[2 of 2 positions shown; findings below may reference images not displayed]

FINDINGS: The cardiac and mediastinal silhouettes are within normal limits.

The lungs are normally inflated. No airspace consolidation, pleural
effusion, or pulmonary edema is identified. There is no
pneumothorax.

No acute osseous abnormality identified.
IMPRESSION: No active cardiopulmonary disease.

## 2020-07-14 ENCOUNTER — Other Ambulatory Visit: Payer: Self-pay

## 2020-07-14 ENCOUNTER — Ambulatory Visit
Admission: RE | Admit: 2020-07-14 | Discharge: 2020-07-14 | Disposition: A | Discharge status: Home / Self Care | Payer: Self-pay | Source: Ambulatory Visit | Attending: Urgent Care | Admitting: Urgent Care

## 2020-07-14 VITALS — BP 125/70 | HR 72 | Temp 99.4°F | Resp 18 | Ht 61.0 in | Wt 190.0 lb

## 2020-07-14 DIAGNOSIS — B001 Herpesviral vesicular dermatitis: Secondary | ICD-10-CM

## 2020-07-14 DIAGNOSIS — R5383 Other fatigue: Secondary | ICD-10-CM

## 2020-07-14 DIAGNOSIS — H9201 Otalgia, right ear: Secondary | ICD-10-CM

## 2020-07-14 DIAGNOSIS — B349 Viral infection, unspecified: Secondary | ICD-10-CM

## 2020-07-14 DIAGNOSIS — B002 Herpesviral gingivostomatitis and pharyngotonsillitis: Secondary | ICD-10-CM

## 2020-07-14 DIAGNOSIS — R070 Pain in throat: Secondary | ICD-10-CM

## 2020-07-14 MED ORDER — VALACYCLOVIR HCL 1 G PO TABS: ORAL_TABLET | ORAL | 5 refills | Status: DC

## 2020-07-14 MED ORDER — PSEUDOEPHEDRINE HCL 60 MG PO TABS: 30.0000 mg | ORAL_TABLET | Freq: Three times a day (TID) | ORAL | 0 refills | Status: DC | PRN

## 2020-07-14 MED ORDER — CETIRIZINE HCL 10 MG PO TABS: 10.0000 mg | ORAL_TABLET | Freq: Every day | ORAL | 0 refills | Status: DC

## 2020-07-14 NOTE — ED Provider Notes (Signed)
Vivien Rossetti   MRN: [id number] DOB: [birth year]  Subjective:   JESLYN AMSLER is a 29 y.o. female presenting for sunburn the week of August 14.  Patient states that she was at the beach and got a severe sunburn, subsequently felt very ill.  States that she ended up getting bilateral ear pain, worse on the right, throat pain, lymph node/neck pain.  She has had cold sores before but only as a child and then throughout her teenage years and early 7s did not have any.  This is the worst episode of blisters on her lips that she has had.  Has not been Covid vaccinated.  Admits that her symptoms are better overall including the blisters.  Has been using Vaseline on them.  No current facility-administered medications for this encounter.  Current Outpatient Medications:  .  benzonatate (TESSALON PERLES) 100 MG capsule, Take 1 capsule (100 mg total) by mouth 3 (three) times daily as needed for cough., Disp: 30 capsule, Rfl: 0 .  loratadine (CLARITIN) 10 MG tablet, Take 10 mg by mouth daily., Disp: , Rfl:  .  metroNIDAZOLE (FLAGYL) 500 MG tablet, Take 1 tablet (500 mg total) by mouth 2 (two) times daily., Disp: 14 tablet, Rfl: 0 .  SPRINTEC 28 0.25-35 MG-MCG tablet, TAKE 1 TABLET BY MOUTH DAILY., Disp: 28 tablet, Rfl: 11   Allergies  Allergen Reactions  . Penicillins Hives and Nausea And Vomiting    Past Medical History:  Diagnosis Date  . Bronchitis, allergic   . Seasonal allergies      Past Surgical History:  Procedure Laterality Date  . APPENDECTOMY  2002    Family History  Problem Relation Age of Onset  . Seizures Mother   . Ovarian cancer Mother   . Cervical cancer Mother   . Lupus Mother   . Kidney disease Mother   . Hypertension Father   . Hypertension Paternal Grandmother   . Stroke Paternal Grandfather   . Hypertension Paternal Grandfather   . Heart disease Maternal Grandmother     Social History   Tobacco Use  . Smoking status: Current Some Day Smoker     Packs/day: 0.00    Years: 1.00    Pack years: 0.00    Types: Cigarettes    Last attempt to quit: 07/12/2013    Years since quitting: 7.0  Substance Use Topics  . Alcohol use: No  . Drug use: No    Comment: august 2014    ROS   Objective:   Vitals: BP 125/70 (BP Location: Left Arm)   Pulse 72   Temp 99.4 F (37.4 C) (Oral)   Resp 18   Ht 5\' 1"  (1.549 m)   Wt 190 lb (86.2 kg)   LMP 06/28/2020   SpO2 98%   BMI 35.90 kg/m   Physical Exam Constitutional:      General: She is not in acute distress.    Appearance: She is well-developed. She is not ill-appearing, toxic-appearing or diaphoretic.  HENT:     Head: Normocephalic and atraumatic.     Right Ear: Tympanic membrane, ear canal and external ear normal. No drainage or tenderness. No middle ear effusion. Tympanic membrane is not erythematous.     Left Ear: Tympanic membrane, ear canal and external ear normal. No drainage or tenderness.  No middle ear effusion. Tympanic membrane is not erythematous.     Nose: No congestion or rhinorrhea.     Mouth/Throat:     Mouth: Mucous membranes are  moist. No oral lesions.     Pharynx: Oropharynx is clear. No pharyngeal swelling, oropharyngeal exudate, posterior oropharyngeal erythema or uvula swelling.     Tonsils: No tonsillar exudate or tonsillar abscesses.   Eyes:     General: No scleral icterus.       Right eye: No discharge.        Left eye: No discharge.     Extraocular Movements: Extraocular movements intact.     Right eye: Normal extraocular motion.     Left eye: Normal extraocular motion.     Conjunctiva/sclera: Conjunctivae normal.     Pupils: Pupils are equal, round, and reactive to light.  Cardiovascular:     Rate and Rhythm: Normal rate.  Pulmonary:     Effort: Pulmonary effort is normal.  Musculoskeletal:     Cervical back: Normal range of motion and neck supple.  Lymphadenopathy:     Cervical: No cervical adenopathy.  Skin:    General: Skin is warm and  dry.  Neurological:     General: No focal deficit present.     Mental Status: She is alert and oriented to person, place, and time.  Psychiatric:        Mood and Affect: Mood normal.        Behavior: Behavior normal.        Thought Content: Thought content normal.        Judgment: Judgment normal.     Assessment and Plan :   PDMP not reviewed this encounter.  1. Oral herpes simplex infection   2. Recurrent cold sores   3. Viral syndrome   4. Right ear pain   5. Throat pain   6. Malaise and fatigue     Patient is to use Valtrex for her cold sores. Counseled on dosing for future use of Valtrex for any outbreaks that occur going forward. Counseled that she very likely had a viral syndrome as will do Covid testing for this as well.  Recommend supportive care otherwise.  Counseled patient on potential for adverse effects with medications prescribed/recommended today, ER and return-to-clinic precautions discussed, patient verbalized understanding.    Wallis Bamberg, [place] 07/14/20 1042

## 2020-07-14 NOTE — Discharge Instructions (Signed)
We will treat your current cold sores as if it were the first time he had them.  This is a dosing that will require you take 1 tablet twice daily for 7 days.  For future outbreaks of cold sores follow the instructions on the label that states take 2 tablets twice daily for 1 day.   We will notify you of your COVID-19 test results as they arrive and may take between 24 to 48 hours.  I encourage you to sign up for MyChart if you have not already done so as this can be the easiest way for [place] to communicate results to you online or through a phone app.  In the meantime, if you develop worsening symptoms including fever, chest pain, shortness of breath despite our current treatment plan then please report to the emergency room as this may be a sign of worsening status from possible COVID-19 infection.  Otherwise, we will manage this as a viral syndrome. For sore throat or cough try using a honey-based tea. Use 3 teaspoons of honey with juice squeezed from half lemon. Place shaved pieces of ginger into 1/2-1 cup of water and warm over stove top. Then mix the ingredients and repeat every 4 hours as needed. Please take Tylenol 500mg -650mg  every 6 hours for aches and pains, fevers. Hydrate very well with at least 2 liters of water. Eat light meals such as soups to replenish electrolytes and soft fruits, veggies. Start an antihistamine like Zyrtec, Allegra or Claritin for postnasal drainage, sinus congestion.  You can take this together with pseudoephedrine (Sudafed) at a dose of 60 mg 2-3 times a day as needed for the same kind of congestion.

## 2020-07-14 NOTE — ED Triage Notes (Signed)
Patient states that she started to notice mouth blisters on 08/14. States that she let them dry out but doesn't feel like they are improving. States that she did put Vaseline on them.

## 2020-07-15 LAB — SARS-COV-2, NAA 2 DAY TAT

## 2020-07-15 LAB — NOVEL CORONAVIRUS, NAA: SARS-CoV-2, NAA: NOT DETECTED

## 2020-10-13 ENCOUNTER — Other Ambulatory Visit: Payer: Self-pay

## 2020-10-13 ENCOUNTER — Encounter: Payer: Self-pay | Admitting: Obstetrics & Gynecology

## 2020-10-21 ENCOUNTER — Telehealth: Payer: Self-pay

## 2020-10-21 NOTE — Telephone Encounter (Signed)
Attempted to contact patient per referral from [facility] to schedule a pap smear appointment. Left message on voicemail requesting a return call.

## 2020-12-14 ENCOUNTER — Other Ambulatory Visit: Payer: Self-pay

## 2020-12-14 ENCOUNTER — Other Ambulatory Visit: Payer: Self-pay | Admitting: *Deleted

## 2020-12-14 DIAGNOSIS — Z124 Encounter for screening for malignant neoplasm of cervix: Secondary | ICD-10-CM

## 2020-12-14 NOTE — Progress Notes (Addendum)
Patient: Amber Wilkins           Date of Birth: 1991-09-11           MRN: [id number] Visit Date: 12/14/2020 PCP: Patient, No Pcp Per  Cervical Cancer Screening Do you smoke?: Yes (vape) Have you ever had or been told you have an allergy to latex products?: No Marital status: Single Date of last pap smear: More than 5 yrs ago (2015) Date of last menstrual period: 11/27/20 Number of pregnancies: 1 Number of births: 1 Have you ever had any of the following? Hysterectomy: No Tubal ligation (tubes tied): No Abnormal bleeding: Yes Abnormal pap smear: No Venereal warts: No A sex partner with venereal warts: No A high risk* sex partner: No  Cervical Exam  Abnormal Observations: Cervix Reddened around os. Patient complained of left ovary pain, AUB, and leaking urine.  Recommendations: Per patient her last Pap smear was in 2015 and normal. Per patient has no history of an abnormal Pap smear. No Pap smear results are in Epic. Let patient know if today's Pap smear is normal that her next Pap smear is due in 3 years. Sent referral to the [facility] for [facility] for follow-up for left ovary pain, AUB, and leaking urine. Patient given the [facility] Assistance Application. Let patient know that she will receive the results of her Pap smear by letter or phone within the next couple of weeks.      Patient's History Patient Active Problem List   Diagnosis Date Noted  . Obesity, Class I, BMI 30.0-34.9 (see actual BMI) 06/07/2016  . Oral contraceptive use 06/07/2016   Past Medical History:  Diagnosis Date  . Bronchitis, allergic   . Seasonal allergies     Family History  Problem Relation Age of Onset  . Seizures Mother   . Ovarian cancer Mother   . Cervical cancer Mother   . Lupus Mother   . Kidney disease Mother   . Hypertension Father   . Hypertension Paternal Grandmother   . Stroke Paternal Grandfather   . Hypertension Paternal Grandfather   . Heart  disease Maternal Grandmother     Social History   Occupational History  . Not on file  Tobacco Use  . Smoking status: Former Smoker    Packs/day: 0.00    Years: 1.00    Pack years: 0.00    Types: Cigarettes    Quit date: 07/12/2013    Years since quitting: 7.4  . Smokeless tobacco: Never Used  Vaping Use  . Vaping Use: Former  Substance and Sexual Activity  . Alcohol use: No  . Drug use: No    Comment: august 2014  . Sexual activity: Yes    Partners: Male    Birth control/protection: Pill

## 2020-12-16 ENCOUNTER — Telehealth: Payer: Self-pay | Admitting: *Deleted

## 2020-12-16 ENCOUNTER — Telehealth: Payer: Self-pay

## 2020-12-16 LAB — CYTOLOGY - PAP
Comment: NEGATIVE
Diagnosis: UNDETERMINED — AB
High risk HPV: NEGATIVE

## 2020-12-16 NOTE — Telephone Encounter (Signed)
Patient informed Pap results, ASC-US with negative HPV, per Dr. Jolayne Panther, needs to repeat pap smear within 1  Year. Patient verbalized understanding, and stated that she had completed the [facility] Financial Assistance Forms, needed to know if she can schedule appointment with [facility], as she was referred for possible cyst. Patient informed to schedule appointment, inform [facility] that she in uninsured, completed financial application, can turn in forms to [facility] to be sent to financial department. Patient verbalized understanding, telephone for [facility] provided.

## 2020-12-16 NOTE — Telephone Encounter (Signed)
Patient called with questions about her Pap smear result. Explained to patient that her Pap smear showed some abnormal cells. Let her know that her Pap smear was sent for HPV typing. Informed patient that follow-up will be based on the result of the HPV typing. Patient stated she does not have insurance. Told patient about the [facility] program if additional follow-up is recommended. Let patient know that I will call her when receive HPV results and recommendations. Patient verbalized understanding.

## 2021-01-18 ENCOUNTER — Ambulatory Visit (INDEPENDENT_AMBULATORY_CARE_PROVIDER_SITE_OTHER): Payer: Self-pay | Admitting: Obstetrics & Gynecology

## 2021-01-18 ENCOUNTER — Encounter: Payer: Self-pay | Admitting: Obstetrics & Gynecology

## 2021-01-18 ENCOUNTER — Other Ambulatory Visit: Payer: Self-pay

## 2021-01-18 VITALS — BP 131/81 | HR 62 | Ht 62.0 in | Wt 198.3 lb

## 2021-01-18 DIAGNOSIS — R102 Pelvic and perineal pain: Secondary | ICD-10-CM

## 2021-01-18 DIAGNOSIS — N3946 Mixed incontinence: Secondary | ICD-10-CM

## 2021-01-18 LAB — POCT URINALYSIS DIP (DEVICE)
Bilirubin Urine: NEGATIVE
Glucose, UA: NEGATIVE mg/dL
Hgb urine dipstick: NEGATIVE
Ketones, ur: NEGATIVE mg/dL
Leukocytes,Ua: NEGATIVE
Nitrite: NEGATIVE
Protein, ur: NEGATIVE mg/dL
Specific Gravity, Urine: 1.025 (ref 1.005–1.030)
Urobilinogen, UA: 1 mg/dL (ref 0.0–1.0)
pH: 6 (ref 5.0–8.0)

## 2021-01-18 NOTE — Progress Notes (Signed)
Patient ID: Amber Wilkins, female   DOB: [birth year], 30 y.o.   MRN: [id number]  Chief Complaint  Patient presents with  . Gynecologic Exam  LLQ pain and urine incontinence  HPI Amber Wilkins is a 30 y.o. female.  [id number] Patient's last menstrual period was 12/23/2020 (exact date). Increasing symptoms of urinary frequency, stress incontinence of small amounts of urine and urge, nocturia.In last few months she has developed s/p and LLQ pain and cramps.irregualr menses noted HPI  Past Medical History:  Diagnosis Date  . Bronchitis, allergic   . Seasonal allergies     Past Surgical History:  Procedure Laterality Date  . APPENDECTOMY  2002    Family History  Problem Relation Age of Onset  . Seizures Mother   . Ovarian cancer Mother   . Cervical cancer Mother   . Lupus Mother   . Kidney disease Mother   . Hypertension Father   . Hypertension Paternal Grandmother   . Stroke Paternal Grandfather   . Hypertension Paternal Grandfather   . Heart disease Maternal Grandmother     Social History Social History   Tobacco Use  . Smoking status: Former Smoker    Packs/day: 0.00    Years: 1.00    Pack years: 0.00    Types: Cigarettes    Quit date: 07/12/2013    Years since quitting: 7.5  . Smokeless tobacco: Never Used  Vaping Use  . Vaping Use: Former  Substance Use Topics  . Alcohol use: No  . Drug use: Not Currently    Types: Marijuana    Comment: august 2014    Allergies  Allergen Reactions  . Penicillins Hives and Nausea And Vomiting    Current Outpatient Medications  Medication Sig Dispense Refill  . cetirizine (ZYRTEC ALLERGY) 10 MG tablet Take 1 tablet (10 mg total) by mouth daily. 30 tablet 0  . benzonatate (TESSALON PERLES) 100 MG capsule Take 1 capsule (100 mg total) by mouth 3 (three) times daily as needed for cough. (Patient not taking: Reported on 01/18/2021) 30 capsule 0  . loratadine (CLARITIN) 10 MG tablet Take 10 mg by mouth daily.    .  metroNIDAZOLE (FLAGYL) 500 MG tablet Take 1 tablet (500 mg total) by mouth 2 (two) times daily. 14 tablet 0  . pseudoephedrine (SUDAFED) 60 MG tablet Take 0.5 tablets (30 mg total) by mouth every 8 (eight) hours as needed for congestion. 30 tablet 0  . SPRINTEC 28 0.25-35 MG-MCG tablet TAKE 1 TABLET BY MOUTH DAILY. 28 tablet 11  . valACYclovir (VALTREX) 1000 MG tablet At the start of cold sores over the mouth, take 2 tablets twice daily for 1 day.  Use this regimen in the future as well. 30 tablet 5   No current facility-administered medications for this visit.    Review of Systems Review of Systems  Constitutional: Negative.   Respiratory: Negative.   Gastrointestinal: Negative.   Genitourinary: Positive for frequency, menstrual problem (irregular periods) and pelvic pain. Negative for vaginal discharge.    Blood pressure 131/81, pulse 62, height 5\' 2"  (1.575 m), weight 198 lb 4.8 oz (89.9 kg), last menstrual period 12/23/2020.  Physical Exam Physical Exam Constitutional:      Appearance: Normal appearance.  Pulmonary:     Effort: Pulmonary effort is normal.  Skin:    General: Skin is warm and dry.  Neurological:     Mental Status: She is alert.  Psychiatric:        Mood and Affect: Mood  normal.        Behavior: Behavior normal.     Data Reviewed Pap ASCUS neg HPV  Assessment Mixed stress and urge urinary incontinence - Plan: Urine Culture, Ambulatory referral to Urogynecology, US PELVIC COMPLETE WITH TRANSVAGINAL  Pelvic pain in female - Plan: US PELVIC COMPLETE WITH TRANSVAGINAL    Plan Orders Placed This Encounter  Procedures  . Urine Culture  . US PELVIC COMPLETE WITH TRANSVAGINAL  . Ambulatory referral to Urogynecology  . POCT urinalysis dip (device)       Scheryl Darter 01/18/2021, 4:47 PM

## 2021-01-18 NOTE — Progress Notes (Signed)
Complaints of left ovary pain for about 6 months also abnormal bleeding. She states that she has been having abromal spotting, skips periods about every 3 months. Had a heavy period about 6 months ago when ovary pain started. Leaking urine with normal ADL.

## 2021-01-20 LAB — URINE CULTURE

## 2021-02-02 ENCOUNTER — Ambulatory Visit
Admission: RE | Admit: 2021-02-02 | Discharge: 2021-02-02 | Disposition: A | Discharge status: Home / Self Care | Payer: Self-pay | Source: Ambulatory Visit | Attending: Obstetrics & Gynecology | Admitting: Obstetrics & Gynecology

## 2021-02-02 ENCOUNTER — Other Ambulatory Visit: Payer: Self-pay

## 2021-02-02 DIAGNOSIS — R102 Pelvic and perineal pain: Secondary | ICD-10-CM | POA: Insufficient documentation

## 2021-02-02 DIAGNOSIS — N3946 Mixed incontinence: Secondary | ICD-10-CM | POA: Insufficient documentation

## 2021-03-08 ENCOUNTER — Telehealth: Payer: Self-pay

## 2021-03-08 NOTE — Progress Notes (Deleted)
[facility] New Patient Evaluation and Consultation  Referring Provider: Adam Phenix, MD PCP: Patient, No Pcp Per (Inactive) Date of Service: 03/09/2021  SUBJECTIVE Chief Complaint: No chief complaint on file.  History of Present Illness: Amber Wilkins is a 30 y.o. White or Caucasian female seen in consultation at the request of Dr. Debroah Loop for evaluation of incontinence.    Review of records from Dr Debroah Loop significant for: Increasing symptoms of urinary frequency, stress incontinence. Also has nocturia. Urine culture 01/18/21 negative for infection.   Urinary Symptoms: {urine leakage?:24754} Leaks *** time(s) per {days/wks/mos/yrs:310907}.  Pad use: {NUMBERS 1-10:18281} {pad option:24752} per day.   She {ACTION; IS/IS VHQ:46962952} bothered by her UI symptoms.  Day time voids ***.  Nocturia: *** times per night to void. Voiding dysfunction: she {empties:24755} her bladder well.  {DOES NOT does:27190::"does not"} use a catheter to empty bladder.  When urinating, she feels {urine symptoms:24756} Drinks: *** per day  UTIs: {NUMBERS 1-10:18281} UTI's in the last year.   {ACTIONS;DENIES/REPORTS:21021675::"Denies"} history of {urologic concerns:24757}  Pelvic Organ Prolapse Symptoms:                  She {denies/ admits to:24761} a feeling of a bulge the vaginal area. It has been present for {NUMBER 1-10:22536} {days/wks/mos/yrs:310907}.  She {denies/ admits to:24761} seeing a bulge.  This bulge {ACTION; IS/IS WUX:32440102} bothersome.  Bowel Symptom: Bowel movements: *** time(s) per {Time; day/week/month:13537} Stool consistency: {stool consistency:24758} Straining: {yes/no:19897}.  Splinting: {yes/no:19897}.  Incomplete evacuation: {yes/no:19897}.  She {denies/ admits to:24761} accidental bowel leakage / fecal incontinence  Occurs: *** time(s) per {Time; day/week/month:13537}  Consistency with leakage: {stool consistency:24758} Bowel regimen: {bowel  regimen:24759} Last colonoscopy: Date ***, Results ***  Sexual Function Sexually active: {yes/no:19897}.  Sexual orientation: {Sexual [phone]} Pain with sex: {pain with sex:24762}  Pelvic Pain {denies/ admits to:24761} pelvic pain Location: *** Pain occurs: *** Prior pain treatment: *** Improved by: *** Worsened by: ***   Past Medical History:  Past Medical History:  Diagnosis Date  . Bronchitis, allergic   . Seasonal allergies      Past Surgical History:   Past Surgical History:  Procedure Laterality Date  . APPENDECTOMY  2002     Past OB/GYN History: G{NUMBERS 1-10:18281} P{NUMBERS 1-10:18281} Vaginal deliveries: ***,  Forceps/ Vacuum deliveries: ***, Cesarean section: *** Menopausal: {menopausal:24763} Contraception: ***. Last pap smear was ***.  Any history of abnormal pap smears: {yes/no:19897}.   Medications: She has a current medication list which includes the following prescription(s): benzonatate, cetirizine, loratadine, metronidazole, pseudoephedrine, sprintec 28, and valacyclovir.   Allergies: Patient is allergic to penicillins.   Social History:  Social History   Tobacco Use  . Smoking status: Former Smoker    Packs/day: 0.00    Years: 1.00    Pack years: 0.00    Types: Cigarettes    Quit date: 07/12/2013    Years since quitting: 7.6  . Smokeless tobacco: Never Used  Vaping Use  . Vaping Use: Former  Substance Use Topics  . Alcohol use: No  . Drug use: Not Currently    Types: Marijuana    Comment: august 2014    Relationship status: {relationship status:24764} She lives with ***.   She {ACTION; IS/IS VOZ:36644034} employed ***. Regular exercise: {Yes/No:304960894} History of abuse: {Yes/No:304960894}  Family History:   Family History  Problem Relation Age of Onset  . Seizures Mother   . Ovarian cancer Mother   . Cervical cancer Mother   . Lupus Mother   .  Kidney disease Mother   . Hypertension Father   .  Hypertension Paternal Grandmother   . Stroke Paternal Grandfather   . Hypertension Paternal Grandfather   . Heart disease Maternal Grandmother      Review of Systems: ROS   OBJECTIVE Physical Exam: There were no vitals filed for this visit.  Physical Exam   GU / Detailed Urogynecologic Evaluation:  Pelvic Exam: Normal external female genitalia; Bartholin's and Skene's glands normal in appearance; urethral meatus normal in appearance, no urethral masses or discharge.   CST: {gen negative/positive:315881}  Reflexes: bulbocavernosis {DESC; PRESENT/NOT PRESENT:21021351}, anocutaneous {DESC; PRESENT/NOT PRESENT:21021351} ***bilaterally.  Speculum exam reveals normal vaginal mucosa {With/Without:20273} atrophy. Cervix {exam; gyn cervix:30847}. Uterus {exam; pelvic uterus:30849}. Adnexa {exam; adnexa:12223}.    s/p hysterectomy: Speculum exam reveals normal vaginal mucosa {With/Without:20273}  atrophy and normal vaginal cuff.  Adnexa {exam; adnexa:12223}.    With apex supported, anterior compartment defect was {reduced:24765}  Pelvic floor strength {Roman # I-V:19040}/V, puborectalis {Roman # I-V:19040}/V external anal sphincter {Roman # I-V:19040}/V  Pelvic floor musculature: Right levator {Tender/Non-tender:20250}, Right obturator {Tender/Non-tender:20250}, Left levator {Tender/Non-tender:20250}, Left obturator {Tender/Non-tender:20250}  POP-Q:   POP-Q                                               Aa                                               Ba                                                 C                                                Gh                                               Pb                                               tvl                                                Ap                                               Bp  D     Rectal Exam:  Normal sphincter tone, {rectocele:24766} distal  rectocele, enterocoele {DESC; PRESENT/NOT PRESENT:21021351}, no rectal masses, {sign of:24767} dyssynergia when asking the patient to bear down.  Post-Void Residual (PVR) by Bladder Scan: In order to evaluate bladder emptying, we discussed obtaining a postvoid residual and she agreed to this procedure.  Procedure: The ultrasound unit was placed on the patient's abdomen in the suprapubic region after the patient had voided. A PVR of *** ml was obtained by bladder scan.  Laboratory Results: @ENCLABS @   ***I visualized the urine specimen, noting the specimen to be {urine color:24768}  ASSESSMENT AND PLAN Ms. Colquhoun is a 30 y.o. with: No diagnosis found.    26, MD   Medical Decision Making:  - Reviewed/ ordered a clinical laboratory test - Reviewed/ ordered a radiologic study - Reviewed/ ordered medicine test - Decision to obtain old records - Discussion of management of or test interpretation with an external physician / other healthcare professional  - Assessment requiring independent historian - Review and summation of prior records - Independent review of image, tracing or specimen

## 2021-03-08 NOTE — Telephone Encounter (Signed)
Attempt made to contact Amber Wilkins is a 30 y.o. female  re: New pt Pre appt call to collect history information.  -Allergy -Medication -Confirm pharmacy -OB history   Pt was not available. LM on the VM for the patient to call back

## 2021-03-09 ENCOUNTER — Ambulatory Visit: Payer: Self-pay | Admitting: Obstetrics and Gynecology

## 2021-03-09 NOTE — Progress Notes (Signed)
[facility] New Patient Evaluation and Consultation  Referring Provider: Adam Phenix, MD PCP: Patient, No Pcp Per (Inactive) Date of Service: 03/10/2021  SUBJECTIVE Chief Complaint: New Patient (Initial Visit)  History of Present Illness: Amber Wilkins is a 30 y.o. White or Caucasian female seen in consultation at the request of Dr. Debroah Loop for evaluation of incontinence.    Review of records from Dr Debroah Loop significant for: Increasing urinary frequency and nocturia. Has both stress and urge symptoms.   Urinary Symptoms: Leaks urine with cough/ sneeze, laughing, lifting, going from sitting to standing, with a full bladder, without sensation and continuously. Most of the time she has no idea it is happening.  Leaks "all day"  Pad use: 2 liners/ mini-pads per day.   She is bothered by her UI symptoms.  Day time voids 5.  Nocturia: 1-2 times per night to void. Voiding dysfunction: she empties her bladder well.  does not use a catheter to empty bladder.  When urinating, she feels a weak stream, difficulty starting urine stream, dribbling after finishing, the need to urinate multiple times in a row and to push on her belly or vagina to empty bladder Drinks: 1/2 cup coffee in AM, 2-3 20oz bottles water, 2-8oz cans soda per day  UTIs: 0 UTI's in the last year.   Denies history of blood in urine and kidney or bladder stones  Pelvic Organ Prolapse Symptoms:                  She Admits to a feeling of a bulge the vaginal area. It has been present for 2 years.  She Denies seeing a bulge.  This bulge is bothersome.  Bowel Symptom: Bowel movements: 1 time(s) per day Stool consistency: hard Straining: no.  Splinting: no.  Incomplete evacuation: no.  She Denies accidental bowel leakage / fecal incontinence Bowel regimen: none  Sexual Function Sexually active: yes.  Pain with sex: Yes, deep in the pelvis  Pelvic Pain Admits to pelvic pain Location: above vagina in  stomach Pain occurs: throughout the day Prior pain treatment: none Improved by: drinking more water Worsened by: not enough water   Past Medical History:  Past Medical History:  Diagnosis Date  . Bronchitis, allergic   . Seasonal allergies      Past Surgical History:   Past Surgical History:  Procedure Laterality Date  . APPENDECTOMY  2002     Past OB/GYN History: G1 P1 Vaginal deliveries: 1,  Forceps/ Vacuum deliveries: 0, Cesarean section: 0 Menopausal: No, LMP Patient's last menstrual period was 01/25/2021. Has irregular periods.  Contraception: condoms Last pap smear was 11/2020- ASCUS neg HPV.  Needs to repeat pap in a year   Medications: She has a current medication list which includes the following prescription(s): pseudoephedrine, benzonatate, cetirizine, loratadine, metronidazole, sprintec 28, and valacyclovir.   Allergies: Patient is allergic to penicillins.   Social History:  Social History   Tobacco Use  . Smoking status: Former Smoker    Packs/day: 0.00    Years: 1.00    Pack years: 0.00    Types: Cigarettes    Quit date: 07/12/2013    Years since quitting: 7.6  . Smokeless tobacco: Never Used  Vaping Use  . Vaping Use: Former  Substance Use Topics  . Alcohol use: No  . Drug use: Not Currently    Types: Marijuana    Comment: august 2014    Relationship status: long-term partner She lives with partner and daughter.  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    Family History:   Family History  Problem Relation Age of Onset  . Seizures Mother   . Ovarian cancer Mother   . Cervical cancer Mother   . Lupus Mother   . Kidney disease Mother   . Hypertension Father   . Hypertension Paternal Grandmother   . Stroke Paternal Grandfather   . Hypertension Paternal Grandfather   . Heart disease Maternal Grandmother      Review of Systems: Review of Systems  Constitutional: Negative for fever, malaise/fatigue and  weight loss.  Respiratory: Negative for cough, shortness of breath and wheezing.   Cardiovascular: Negative for chest pain, palpitations and leg swelling.  Gastrointestinal: Positive for abdominal pain. Negative for blood in stool.  Genitourinary: Negative for dysuria.  Musculoskeletal: Negative for myalgias.  Skin: Negative for rash.  Neurological: Negative for dizziness and headaches.  Endo/Heme/Allergies: Does not bruise/bleed easily.  Psychiatric/Behavioral: Negative for depression. The patient is not nervous/anxious.      OBJECTIVE Physical Exam: Vitals:   03/10/21 1454  BP: 133/83  Pulse: 79  Weight: 198 lb (89.8 kg)  Height: 5\' 1"  (1.549 m)    Physical Exam Constitutional:      General: She is not in acute distress. Pulmonary:     Effort: Pulmonary effort is normal.  Abdominal:     General: There is no distension.     Palpations: Abdomen is soft.     Tenderness: There is no abdominal tenderness. There is no rebound.  Musculoskeletal:        General: No swelling. Normal range of motion.  Skin:    General: Skin is warm and dry.     Findings: No rash.  Neurological:     Mental Status: She is alert and oriented to person, place, and time.  Psychiatric:        Mood and Affect: Mood normal.        Behavior: Behavior normal.     GU / Detailed Urogynecologic Evaluation:  Pelvic Exam: Normal external female genitalia; Bartholin's and Skene's glands normal in appearance; urethral meatus normal in appearance, no urethral masses or discharge.   CST: negative  Speculum exam reveals normal vaginal mucosa without atrophy. Cervix normal appearance. Uterus normal single, nontender. Adnexa no mass, fullness, tenderness.    Pelvic floor strength IV/V  Pelvic floor musculature: Right levator non-tender, Right obturator non-tender, Left levator tender, Left obturator tender  POP-Q:   POP-Q  -0.5                                            Aa   -0.5                                            Ba  -5                                              C   3.5  Gh  4                                            Pb  9.5                                            tvl   -3                                            Ap  -3                                            Bp  -9                                              D     Rectal Exam:  Normal external rectum  Post-Void Residual (PVR) by Bladder Scan: In order to evaluate bladder emptying, we discussed obtaining a postvoid residual and she agreed to this procedure.  Procedure: The ultrasound unit was placed on the patient's abdomen in the suprapubic region after the patient had voided. A PVR of 35 ml was obtained by bladder scan.  Laboratory Results: POC urine: negative  I visualized the urine specimen, noting the specimen to be dark yellow  ASSESSMENT AND PLAN Ms. Spease is a 30 y.o. with:  1. Overactive bladder   2. Urinary frequency   3. SUI (stress urinary incontinence, female)   4. Levator spasm    1. OAB We discussed the symptoms of overactive bladder (OAB), which include urinary urgency, urinary frequency, nocturia, with or without urge incontinence.  While we do not know the exact etiology of OAB, several treatment options exist. We discussed management including behavioral therapy (decreasing bladder irritants, urge suppression strategies, timed voids, bladder retraining), physical therapy, medication; for refractory cases posterior tibial nerve stimulation, sacral neuromodulation, and intravesical botulinum toxin injection.  - She would like to try physical therapy first before proceeding with medication. Referral placed.   2. SUI For treatment of stress urinary incontinence,  non-surgical options include expectant management, weight loss, physical therapy, as well as a pessary.  We did not discuss surgical options.  - She would like to try physical  therapy and a pessary    3. Levator spasm - The origin of pelvic floor muscle spasm can be multifactorial, including primary, reactive to a different pain source, trauma, or even part of a centralized pain syndrome.Treatment options include pelvic floor physical therapy, local (vaginal) or oral  muscle relaxants, pelvic muscle trigger point injections or centrally acting pain medications.   - She would like to work with physical therapy to help reduce her pain.   Marguerita Beards, MD   Medical Decision Making:  - Reviewed/ ordered a clinical laboratory test - Review and summation of prior records

## 2021-03-10 ENCOUNTER — Ambulatory Visit (INDEPENDENT_AMBULATORY_CARE_PROVIDER_SITE_OTHER): Payer: Self-pay | Admitting: Obstetrics and Gynecology

## 2021-03-10 ENCOUNTER — Encounter: Payer: Self-pay | Admitting: Obstetrics and Gynecology

## 2021-03-10 ENCOUNTER — Other Ambulatory Visit: Payer: Self-pay

## 2021-03-10 VITALS — BP 133/83 | HR 79 | Ht 61.0 in | Wt 198.0 lb

## 2021-03-10 DIAGNOSIS — N393 Stress incontinence (female) (male): Secondary | ICD-10-CM

## 2021-03-10 DIAGNOSIS — R350 Frequency of micturition: Secondary | ICD-10-CM

## 2021-03-10 DIAGNOSIS — M62838 Other muscle spasm: Secondary | ICD-10-CM

## 2021-03-10 DIAGNOSIS — N3281 Overactive bladder: Secondary | ICD-10-CM

## 2021-03-10 LAB — POCT URINALYSIS DIPSTICK
Appearance: NORMAL
Bilirubin, UA: NEGATIVE
Blood, UA: NEGATIVE
Glucose, UA: NEGATIVE
Ketones, UA: NEGATIVE
Leukocytes, UA: NEGATIVE
Nitrite, UA: NEGATIVE
Protein, UA: NEGATIVE
Spec Grav, UA: >=1.03 — AB (ref 1.010–1.025)
Urobilinogen, UA: 0.2 E.U./dL
pH, UA: 5 (ref 5.0–8.0)

## 2021-03-16 ENCOUNTER — Other Ambulatory Visit: Payer: Self-pay

## 2021-03-16 ENCOUNTER — Encounter: Payer: Self-pay | Admitting: Obstetrics and Gynecology

## 2021-03-16 ENCOUNTER — Ambulatory Visit (INDEPENDENT_AMBULATORY_CARE_PROVIDER_SITE_OTHER): Payer: Self-pay | Admitting: Obstetrics and Gynecology

## 2021-03-16 VITALS — BP 123/85 | HR 83 | Ht 61.0 in | Wt 198.0 lb

## 2021-03-16 DIAGNOSIS — N8110 Cystocele, unspecified: Secondary | ICD-10-CM

## 2021-03-16 DIAGNOSIS — N393 Stress incontinence (female) (male): Secondary | ICD-10-CM

## 2021-03-16 NOTE — Patient Instructions (Signed)
You were fitted with a #2 incontinence dish pessary.  You can take out every week, before intercourse or sooner if you desire. Clean with liquid soap and water in between uses and leave out over night on the night that you remove it. Call for any problems.

## 2021-03-16 NOTE — Progress Notes (Signed)
[facility]   Subjective:     Chief Complaint: Pessary Fitting  History of Present Illness: Amber Wilkins is a 30 y.o. female with stage II pelvic organ prolapse, stress incontinence and OAB who presents today for a pessary fitting.   She has not had any changes to her symptoms since last visit.   Past Medical History: Patient  has a past medical history of Bronchitis, allergic and Seasonal allergies.   Past Surgical History: She  has a past surgical history that includes Appendectomy (2002).   Medications: She has a current medication list which includes the following prescription(s): cetirizine, benzonatate, loratadine, metronidazole, pseudoephedrine, sprintec 28, and valacyclovir.   Allergies: Patient is allergic to penicillins.   Social History: Patient  reports that she quit smoking about 7 years ago. Her smoking use included cigarettes. She smoked 0.00 packs per day for 1.00 year. She has never used smokeless tobacco. She reports previous drug use. Drug: Marijuana. She reports that she does not drink alcohol.      Objective:    BP 123/85   Pulse 83   Ht 5\' 1"  (1.549 m)   Wt 198 lb (89.8 kg)   LMP 01/25/2021   BMI 37.41 kg/m  Gen: No apparent distress, A&O x 3. Pelvic Exam: Normal external female genitalia; Bartholin's and Skene's glands normal in appearance; urethral meatus normal in appearance, no urethral masses or discharge.   A size #2 incontinence dish with support pessary was fitted. It was comfortable, stayed in place with valsalva and was an appropriate size on examination, with one finger fitting between the pessary and the vaginal walls. We tied a string to it and the patient demonstrated proper removal and replacement.    POP-Q  -0.5                                            Aa   -0.5                                           Ba  -5                                              C   3.5                                             Gh  4                                            Pb  9.5                                            tvl   -3  Ap  -3                                            Bp  -9                                              D     Assessment/Plan:    Assessment: Amber Wilkins is a 30 y.o. with stage II pelvic organ prolapse, stress incontinence and OAB who presents for a pessary fitting.  Plan: She was fitted with a #2 incontinence dish with support pessary. She will remove as needed.  She will also start with pelvic floor physical therapy.   Follow-up in 3 months for a pessary check or sooner as needed.  All questions were answered.   Marguerita Beards, MD

## 2021-04-21 ENCOUNTER — Encounter: Payer: Self-pay | Admitting: Emergency Medicine

## 2021-04-21 ENCOUNTER — Other Ambulatory Visit: Payer: Self-pay

## 2021-04-21 ENCOUNTER — Ambulatory Visit
Admission: EM | Admit: 2021-04-21 | Discharge: 2021-04-21 | Disposition: A | Discharge status: Home / Self Care | Payer: Self-pay | Attending: Physician Assistant | Admitting: Physician Assistant

## 2021-04-21 DIAGNOSIS — R062 Wheezing: Secondary | ICD-10-CM

## 2021-04-21 DIAGNOSIS — J309 Allergic rhinitis, unspecified: Secondary | ICD-10-CM

## 2021-04-21 DIAGNOSIS — J45909 Unspecified asthma, uncomplicated: Secondary | ICD-10-CM

## 2021-04-21 MED ORDER — ALBUTEROL SULFATE HFA 108 (90 BASE) MCG/ACT IN AERS: 1.0000 | INHALATION_SPRAY | Freq: Four times a day (QID) | RESPIRATORY_TRACT | 2 refills | Status: DC | PRN

## 2021-04-21 MED ORDER — PREDNISONE 20 MG PO TABS: 40.0000 mg | ORAL_TABLET | Freq: Every day | ORAL | 0 refills | Status: AC

## 2021-04-21 NOTE — ED Provider Notes (Signed)
[facility]    CSN: [id number] Arrival date & time: 04/21/21  1800      History   Chief Complaint Chief Complaint  Patient presents with  . URI    HPI Amber Wilkins is a 30 y.o. female presenting for approximately 1 month history of cough, congestion, runny nose, shortness of breath and wheezing.  Patient says her symptoms seem to be worse at night.  She does have history of allergies.  Patient suspects she may have allergy induced asthma.  He says that her child has allergy induced asthma and her symptoms seem similar but she has not been formally diagnosed with asthma.  Patient takes Zyrtec for her allergies which normally helps.  She says it has not helped the wheezing and she believes she may need an inhaler at this point.  She has not had any fevers or fatigue.  She says her chest has been a little bit tight at times but not painful.  She says she presently feels slightly short of breath.  She denies any COVID exposure.  Has not been vaccinated for COVID-19.  No other concerns.   HPI  Past Medical History:  Diagnosis Date  . Bronchitis, allergic   . Seasonal allergies     Patient Active Problem List   Diagnosis Date Noted  . Obesity, Class I, BMI 30.0-34.9 (see actual BMI) 06/07/2016  . Oral contraceptive use 06/07/2016    Past Surgical History:  Procedure Laterality Date  . APPENDECTOMY  2002    OB History    Gravida  1   Para  1   Term  1   Preterm      AB      Living  1     SAB      IAB      Ectopic      Multiple      Live Births  1            Home Medications    Prior to Admission medications   Medication Sig Start Date End Date Taking? Authorizing Provider  albuterol (VENTOLIN HFA) 108 (90 Base) MCG/ACT inhaler Inhale 1-2 puffs into the lungs every 6 (six) hours as needed for wheezing or shortness of breath. 04/21/21 04/21/22 Yes Shirlee Latch, PA-C  predniSONE (DELTASONE) 20 MG tablet Take 2 tablets (40 mg total) by mouth  daily for 5 days. 04/21/21 04/26/21 Yes Shirlee Latch, PA-C  benzonatate (TESSALON PERLES) 100 MG capsule Take 1 capsule (100 mg total) by mouth 3 (three) times daily as needed for cough. Patient not taking: Reported on 03/10/2021 08/07/17   Little, Traci M, PA-C  cetirizine (ZYRTEC ALLERGY) 10 MG tablet Take 1 tablet (10 mg total) by mouth daily. 07/14/20   Wallis Bamberg, PA-C  loratadine (CLARITIN) 10 MG tablet Take 10 mg by mouth daily. Patient not taking: Reported on 03/10/2021    [provider]  metroNIDAZOLE (FLAGYL) 500 MG tablet Take 1 tablet (500 mg total) by mouth 2 (two) times daily. Patient not taking: Reported on 03/10/2021 06/13/16   Adam Phenix, MD  pseudoephedrine (SUDAFED) 60 MG tablet Take 0.5 tablets (30 mg total) by mouth every 8 (eight) hours as needed for congestion. Patient not taking: Reported on 03/16/2021 07/14/20   Wallis Bamberg, PA-C  SPRINTEC 28 0.25-35 MG-MCG tablet TAKE 1 TABLET BY MOUTH DAILY. Patient not taking: Reported on 03/10/2021 04/04/17   Reva Bores, MD  valACYclovir (VALTREX) 1000 MG tablet At the  start of cold sores over the mouth, take 2 tablets twice daily for 1 day.  Use this regimen in the future as well. Patient not taking: Reported on 03/10/2021 07/14/20   Wallis Bamberg, PA-C    Family History Family History  Problem Relation Age of Onset  . Seizures Mother   . Ovarian cancer Mother   . Cervical cancer Mother   . Lupus Mother   . Kidney disease Mother   . Hypertension Father   . Hypertension Paternal Grandmother   . Stroke Paternal Grandfather   . Hypertension Paternal Grandfather   . Heart disease Maternal Grandmother     Social History Social History   Tobacco Use  . Smoking status: Former Smoker    Packs/day: 0.00    Years: 1.00    Pack years: 0.00    Types: Cigarettes    Quit date: 07/12/2013    Years since quitting: 7.7  . Smokeless tobacco: Never Used  Vaping Use  . Vaping Use: Former  Substance Use Topics  . Alcohol  use: No  . Drug use: Not Currently    Types: Marijuana    Comment: august 2014     Allergies   Penicillins   Review of Systems Review of Systems  Constitutional: Negative for chills, diaphoresis, fatigue and fever.  HENT: Positive for congestion and rhinorrhea. Negative for ear pain, sinus pressure, sinus pain and sore throat.   Respiratory: Positive for cough, chest tightness, shortness of breath and wheezing.   Gastrointestinal: Negative for abdominal pain, nausea and vomiting.  Musculoskeletal: Negative for arthralgias and myalgias.  Skin: Negative for rash.  Neurological: Negative for weakness and headaches.  Hematological: Negative for adenopathy.     Physical Exam Triage Vital Signs ED Triage Vitals  Enc Vitals Group     BP 04/21/21 1807 (!) 147/93     Pulse Rate 04/21/21 1807 (!) 106     Resp 04/21/21 1807 18     Temp 04/21/21 1807 98.4 F (36.9 C)     Temp Source 04/21/21 1807 Oral     SpO2 04/21/21 1807 98 %     Weight --      Height --      Head Circumference --      Peak Flow --      Pain Score 04/21/21 1810 1     Pain Loc --      Pain Edu? --      Excl. in GC? --    No data found.  Updated Vital Signs BP (!) 147/93 (BP Location: Left Arm)   Pulse (!) 106   Temp 98.4 F (36.9 C) (Oral)   Resp 18   LMP 01/25/2021   SpO2 98%    Physical Exam Vitals and nursing note reviewed.  Constitutional:      General: She is not in acute distress.    Appearance: Normal appearance. She is not ill-appearing or toxic-appearing.  HENT:     Head: Normocephalic and atraumatic.     Nose: Congestion and rhinorrhea present.     Mouth/Throat:     Mouth: Mucous membranes are moist.     Pharynx: Oropharynx is clear.  Eyes:     General: No scleral icterus.       Right eye: No discharge.        Left eye: No discharge.     Conjunctiva/sclera: Conjunctivae normal.  Cardiovascular:     Rate and Rhythm: Normal rate and regular rhythm.     Heart  sounds: Normal heart  sounds.  Pulmonary:     Effort: Pulmonary effort is normal. No respiratory distress.     Breath sounds: Wheezing (diffuse wheezing throughout) present.  Musculoskeletal:     Cervical back: Neck supple.  Skin:    General: Skin is dry.  Neurological:     General: No focal deficit present.     Mental Status: She is alert. Mental status is at baseline.     Motor: No weakness.     Gait: Gait normal.  Psychiatric:        Mood and Affect: Mood normal.        Behavior: Behavior normal.        Thought Content: Thought content normal.      UC Treatments / Results  Labs (all labs ordered are listed, but only abnormal results are displayed) Labs Reviewed - No data to display  EKG   Radiology No results found.  Procedures Procedures (including critical care time)  Medications Ordered in UC Medications - No data to display  Initial Impression / Assessment and Plan / UC Course  I have reviewed the triage vital signs and the nursing notes.  Pertinent labs & imaging results that were available during my care of the patient were reviewed by me and considered in my medical decision making (see chart for details).   30 year old female presenting for flareup of her allergies and wheezing/shortness of breath over the past month that seems to be getting worse and not improving.  Vital signs are stable.  On exam she does have congestion and rhinorrhea.  Also has diffuse wheezing throughout.  She is not in any respiratory distress.  Oxygen is 98%.  Clinical presentation does fit with allergy induced reactive airway disease/asthma.  I have prescribed ProAir inhaler for patient.  I have also put a refill on it since she does not have a PCP.  Advised to continue with the Zyrtec.  Printed a prescription for prednisone in case her symptoms are not improving with the inhaler alone and encouraged her to seek follow-up with a PCP.  Reviewed ED precautions.  Final Clinical Impressions(s) / UC  Diagnoses   Final diagnoses:  Reactive airway disease without complication, unspecified asthma severity, unspecified whether persistent  Allergic rhinitis, unspecified seasonality, unspecified trigger  Wheezing     Discharge Instructions     Use good Rx for your prescriptions.  The app is free and you can get medications for much cheaper.  If you have trouble using it or figuring out, asked the pharmacist.  Inhalers are pretty expensive without insurance or the good Rx.  I have sent in albuterol inhaler.  It does seem like you are having allergy induced asthma exacerbation.  Use as directed.  Continue with your Zyrtec.  Try to find a PCP in the area.  I did put a couple refills on it but you may need actual asthma testing.  Follow-up with [place] if needed.    ED Prescriptions    Medication Sig Dispense Auth. Provider   albuterol (VENTOLIN HFA) 108 (90 Base) MCG/ACT inhaler Inhale 1-2 puffs into the lungs every 6 (six) hours as needed for wheezing or shortness of breath. 1 g Eusebio Friendly B, PA-C   predniSONE (DELTASONE) 20 MG tablet Take 2 tablets (40 mg total) by mouth daily for 5 days. 10 tablet Gareth Morgan     PDMP not reviewed this encounter.   Shirlee Latch, PA-C 04/21/21 1845

## 2021-04-21 NOTE — Discharge Instructions (Signed)
Use good Rx for your prescriptions.  The app is free and you can get medications for much cheaper.  If you have trouble using it or figuring out, asked the pharmacist.  Inhalers are pretty expensive without insurance or the good Rx.  I have sent in albuterol inhaler.  It does seem like you are having allergy induced asthma exacerbation.  Use as directed.  Continue with your Zyrtec.  Try to find a PCP in the area.  I did put a couple refills on it but you may need actual asthma testing.  Follow-up with [place] if needed.

## 2021-04-21 NOTE — ED Triage Notes (Signed)
Pt here for intermittently asthma/allergy onset 1 month  Sx include: wheezing, chest tightness, prod cough, SOB, dyspnea  Denies f/v/n/d  Taking OTC Zyrtec w/some relief  A&O x4... NAD.Marland Kitchen. ambulatory

## 2021-07-05 NOTE — Progress Notes (Deleted)
[facility]   Subjective:     Chief Complaint: No chief complaint on file.  History of Present Illness: Amber Wilkins is a 30 y.o. female with stage II pelvic organ prolapse, stress incontinence, and OAB who presents for a pessary check. She is using a size #2 incontinence dish with support pessary. The pessary has been working well and she has no complaints.   Past Medical History: Patient  has a past medical history of Bronchitis, allergic and Seasonal allergies.   Past Surgical History: She  has a past surgical history that includes Appendectomy (2002).   Medications: She has a current medication list which includes the following prescription(s): albuterol, benzonatate, cetirizine, loratadine, metronidazole, pseudoephedrine, sprintec 28, and valacyclovir.   Allergies: Patient is allergic to penicillins.   Social History: Patient  reports that she quit smoking about 7 years ago. Her smoking use included cigarettes. She has never used smokeless tobacco. She reports that she does not currently use drugs after having used the following drugs: Marijuana. She reports that she does not drink alcohol.      Objective:    Physical Exam: There were no vitals taken for this visit. Gen: No apparent distress, A&O x 3. Detailed Urogynecologic Evaluation:  Pelvic Exam: Normal external female genitalia; Bartholin's and Skene's glands normal in appearance; urethral meatus {urethra:24773}, no urethral masses or discharge. The pessary was noted to be {in place:24774}. It was removed and cleaned. Speculum exam revealed {vaginal lesions:24775} in the vagina. The pessary was replaced. It was comfortable to the patient and fit well.   No flowsheet data found.  Laboratory Results: Urine dipstick shows: {ua dip:315374::"negative for all components"}.    Assessment/Plan:    Assessment: Amber Wilkins is a 30 y.o. with {PFD symptoms:24771} here for a pessary check. She is doing  well.  Plan: She will {pessary plan:24776}. She will continue to use {lubricant:24777}. She will follow-up in *** {days/wks/mos/yrs:310907} for a pessary check or sooner as needed.  All questions were answered.   Time Spent:

## 2021-07-06 ENCOUNTER — Ambulatory Visit: Payer: Self-pay | Admitting: Obstetrics and Gynecology

## 2022-01-11 ENCOUNTER — Ambulatory Visit: Payer: Self-pay

## 2022-01-11 ENCOUNTER — Telehealth: Payer: Self-pay | Admitting: Physician Assistant

## 2022-01-11 DIAGNOSIS — H109 Unspecified conjunctivitis: Secondary | ICD-10-CM

## 2022-01-11 MED ORDER — POLYMYXIN B-TRIMETHOPRIM 10000-0.1 UNIT/ML-% OP SOLN: 1.0000 [drp] | OPHTHALMIC | 0 refills | Status: DC

## 2022-01-11 MED ORDER — ALBUTEROL SULFATE HFA 108 (90 BASE) MCG/ACT IN AERS: 1.0000 | INHALATION_SPRAY | Freq: Four times a day (QID) | RESPIRATORY_TRACT | 0 refills | Status: DC | PRN

## 2022-01-11 NOTE — Patient Instructions (Signed)
Amber Wilkins, thank you for joining Margaretann Loveless, PA-C for today's virtual visit.  While this provider is not your primary care provider (PCP), if your PCP is located in our provider database this encounter information will be shared with them immediately following your visit.  Consent: (Patient) Amber Wilkins provided verbal consent for this virtual visit at the beginning of the encounter.  Current Medications:  Current Outpatient Medications:    trimethoprim-polymyxin b (POLYTRIM) ophthalmic solution, Place 1 drop into the left eye every 4 (four) hours. X 5-7 days, Disp: 10 mL, Rfl: 0   albuterol (VENTOLIN HFA) 108 (90 Base) MCG/ACT inhaler, Inhale 1-2 puffs into the lungs every 6 (six) hours as needed for wheezing or shortness of breath., Disp: 18 g, Rfl: 0   cetirizine (ZYRTEC ALLERGY) 10 MG tablet, Take 1 tablet (10 mg total) by mouth daily., Disp: 30 tablet, Rfl: 0   Medications ordered in this encounter:  Meds ordered this encounter  Medications   trimethoprim-polymyxin b (POLYTRIM) ophthalmic solution    Sig: Place 1 drop into the left eye every 4 (four) hours. X 5-7 days    Dispense:  10 mL    Refill:  0    Order Specific Question:   Supervising Provider    Answer:   MILLER, BRIAN [[id number]]   albuterol (VENTOLIN HFA) 108 (90 Base) MCG/ACT inhaler    Sig: Inhale 1-2 puffs into the lungs every 6 (six) hours as needed for wheezing or shortness of breath.    Dispense:  18 g    Refill:  0    Order Specific Question:   Supervising Provider    Answer:   Hyacinth Meeker, BRIAN [[id number]]     *If you need refills on other medications prior to your next appointment, please contact your pharmacy*  Follow-Up: Call back or seek an in-person evaluation if the symptoms worsen or if the condition fails to improve as anticipated.  Other Instructions Bacterial Conjunctivitis, Adult Bacterial conjunctivitis is an infection of the clear membrane that covers the white part of the eye and the  inner surface of the eyelid (conjunctiva). When the blood vessels in the conjunctiva become inflamed, the eye becomes red or pink. The eye often feels irritated or itchy. Bacterial conjunctivitis spreads easily from person to person (is contagious). It also spreads easily from one eye to the other eye. What are the causes? This condition is caused by bacteria. You may get the infection if you come into close contact with: A person who is infected with the bacteria. Items that are contaminated with the bacteria, such as a face towel, contact lens solution, or eye makeup. What increases the risk? You are more likely to develop this condition if: You are exposed to other people who have the infection. You wear contact lenses. You have a sinus infection. You have had a recent eye injury or surgery. You have a weak body defense system (immune system). You have a medical condition that causes dry eyes. What are the signs or symptoms? Symptoms of this condition include: Thick, yellowish discharge from the eye. This may turn into a crust on the eyelid overnight and cause your eyelids to stick together. Tearing or watery eyes. Itchy eyes. Burning feeling in your eyes. Eye redness. Swollen eyelids. Blurred vision. How is this diagnosed? This condition is diagnosed based on your symptoms and medical history. Your health care provider may also take a sample of discharge from your eye to find the cause of your  infection. How is this treated? This condition may be treated with: Antibiotic eye drops or ointment to clear the infection more quickly and prevent the spread of infection to others. Antibiotic medicines taken by mouth (orally) to treat infections that do not respond to drops or ointments or that last longer than 10 days. Cool, wet cloths (cool compresses) placed on the eyes. Artificial tears applied 2-6 times a day. Follow these instructions at home: Medicines Take or apply your antibiotic  medicine as told by your health care provider. Do not stop using the antibiotic, even if your condition improves, unless directed by your health care provider. Take or apply over-the-counter and prescription medicines only as told by your health care provider. Be very careful to avoid touching the edge of your eyelid with the eye-drop bottle or the ointment tube when you apply medicines to the affected eye. This will keep you from spreading the infection to your other eye or to other people. Managing discomfort Gently wipe away any drainage from your eye with a warm, wet washcloth or a cotton ball. Apply a clean, cool compress to your eye for 10-20 minutes, 3-4 times a day. General instructions Do not wear contact lenses until the inflammation is gone and your health care provider says it is safe to wear them again. Ask your health care provider how to sterilize or replace your contact lenses before you use them again. Wear glasses until you can resume wearing contact lenses. Avoid wearing eye makeup until the inflammation is gone. Throw away any old eye cosmetics that may be contaminated. Change or wash your pillowcase every day. Do not share towels or washcloths. This may spread the infection. Wash your hands often with soap and water for at least 20 seconds and especially before touching your face or eyes. Use paper towels to dry your hands. Avoid touching or rubbing your eyes. Do not drive or use heavy machinery if your vision is blurred. Contact a health care provider if: You have a fever. Your symptoms do not get better after 10 days. Get help right away if: You have a fever and your symptoms suddenly get worse. You have severe pain when you move your eye. You have facial pain, redness, or swelling. You have a sudden loss of vision. Summary Bacterial conjunctivitis is an infection of the clear membrane that covers the white part of the eye and the inner surface of the eyelid  (conjunctiva). Bacterial conjunctivitis spreads easily from eye to eye and from person to person (is contagious). Wash your hands often with soap and water for at least 20 seconds and especially before touching your face or eyes. Use paper towels to dry your hands. Take or apply your antibiotic medicine as told by your health care provider. Do not stop using the antibiotic even if your condition improves. Contact a health care provider if you have a fever or if your symptoms do not get better after 10 days. Get help right away if you have a sudden loss of vision. This information is not intended to replace advice given to you by your health care provider. Make sure you discuss any questions you have with your health care provider. Document Revised: 02/17/2021 Document Reviewed: 02/17/2021 Elsevier Patient Education  2022 [employer].    If you have been instructed to have an in-person evaluation today at a local Urgent Care facility, please use the link below. It will take you to a list of all of our available [facility]  Urgent Cares, including address, phone number and hours of operation. Please do not delay care.  [facility]  If you or a family member do not have a primary care provider, use the link below to schedule a visit and establish care. When you choose a [facility] primary care physician or advanced practice provider, you gain a long-term partner in health. Find a Primary Care Provider  Learn more about [facility]'s in-office and virtual care options: [facility]

## 2022-01-11 NOTE — Progress Notes (Signed)
Virtual Visit Consent   Amber Wilkins, you are scheduled for a virtual visit with a [facility] provider today.     Just as with appointments in the office, your consent must be obtained to participate.  Your consent will be active for this visit and any virtual visit you may have with one of our providers in the next 365 days.     If you have a MyChart account, a copy of this consent can be sent to you electronically.  All virtual visits are billed to your insurance company just like a traditional visit in the office.    As this is a virtual visit, video technology does not allow for your provider to perform a traditional examination.  This may limit your provider's ability to fully assess your condition.  If your provider identifies any concerns that need to be evaluated in person or the need to arrange testing (such as labs, EKG, etc.), we will make arrangements to do so.     Although advances in technology are sophisticated, we cannot ensure that it will always work on either your end or our end.  If the connection with a video visit is poor, the visit may have to be switched to a telephone visit.  With either a video or telephone visit, we are not always able to ensure that we have a secure connection.     I need to obtain your verbal consent now.   Are you willing to proceed with your visit today?    Amber Wilkins has provided verbal consent on 01/11/2022 for a virtual visit (video or telephone).   Mar Daring, PA-C   Date: 01/11/2022 8:23 AM   Virtual Visit via Video Note   I, Mar Daring, connected with  Amber Wilkins  ([id number], 1991/09/20) on 01/11/22 at  8:15 AM EST by a video-enabled telemedicine application and verified that I am speaking with the correct person using two identifiers.  Location: Patient: Virtual Visit Location Patient: Home Provider: Virtual Visit Location Provider: Home Office   I discussed the limitations of evaluation and management by  telemedicine and the availability of in person appointments. The patient expressed understanding and agreed to proceed.    History of Present Illness: Amber Wilkins is a 31 y.o. who identifies as a female who was assigned female at birth, and is being seen today for possible pink eye.  HPI: Conjunctivitis  The current episode started today. The onset was sudden. The problem has been unchanged. The problem is mild. Associated symptoms include eye itching (yesterday), congestion, ear pain (sunday; now improved), headaches (dull back of head at neck), rhinorrhea, sore throat (yesterday when awaking; now improved), URI, eye discharge and eye redness. Pertinent negatives include no fever, no decreased vision, no double vision, no photophobia, no ear discharge and no eye pain. The left eye is affected. The eye pain is not associated with movement. The eyelid exhibits swelling.   Daughter had pink eye last week.  Problems:  Patient Active Problem List   Diagnosis Date Noted   Obesity, Class I, BMI 30.0-34.9 (see actual BMI) 06/07/2016   Oral contraceptive use 06/07/2016    Allergies:  Allergies  Allergen Reactions   Penicillins Hives and Nausea And Vomiting   Medications:  Current Outpatient Medications:    trimethoprim-polymyxin b (POLYTRIM) ophthalmic solution, Place 1 drop into the left eye every 4 (four) hours. X 5-7 days, Disp: 10 mL, Rfl: 0   albuterol (VENTOLIN HFA)  108 (90 Base) MCG/ACT inhaler, Inhale 1-2 puffs into the lungs every 6 (six) hours as needed for wheezing or shortness of breath., Disp: 18 g, Rfl: 0   cetirizine (ZYRTEC ALLERGY) 10 MG tablet, Take 1 tablet (10 mg total) by mouth daily., Disp: 30 tablet, Rfl: 0  Observations/Objective: Patient is well-developed, well-nourished in no acute distress.  Resting comfortably at home.  Head is normocephalic, atraumatic.  No labored breathing.  Speech is clear and coherent with logical content.  Patient is alert and oriented at  baseline.  Left eye is injected and mild swelling noted in upper and lower lid surrounding  Assessment and Plan: 1. Bacterial conjunctivitis of left eye - trimethoprim-polymyxin b (POLYTRIM) ophthalmic solution; Place 1 drop into the left eye every 4 (four) hours. X 5-7 days  Dispense: 10 mL; Refill: 0  - Suspect bacterial conjunctivitis - Polytrim prescribed - Warm compresses - Good hand hygiene - Seek in person evaluation if not improving or if symptoms worsen  Follow Up Instructions: I discussed the assessment and treatment plan with the patient. The patient was provided an opportunity to ask questions and all were answered. The patient agreed with the plan and demonstrated an understanding of the instructions.  A copy of instructions were sent to the patient via MyChart unless otherwise noted below.   The patient was advised to call back or seek an in-person evaluation if the symptoms worsen or if the condition fails to improve as anticipated.  Time:  I spent 10 minutes with the patient via telehealth technology discussing the above problems/concerns.    Mar Daring, PA-C

## 2022-03-06 ENCOUNTER — Telehealth: Payer: Medicaid Other | Admitting: Nurse Practitioner

## 2022-03-06 DIAGNOSIS — J219 Acute bronchiolitis, unspecified: Secondary | ICD-10-CM | POA: Diagnosis not present

## 2022-03-07 MED ORDER — PREDNISONE 20 MG PO TABS: 20.0000 mg | ORAL_TABLET | Freq: Two times a day (BID) | ORAL | 0 refills | Status: AC

## 2022-03-07 MED ORDER — BENZONATATE 100 MG PO CAPS: 100.0000 mg | ORAL_CAPSULE | Freq: Three times a day (TID) | ORAL | 0 refills | Status: DC | PRN

## 2022-03-07 MED ORDER — ALBUTEROL SULFATE HFA 108 (90 BASE) MCG/ACT IN AERS: 2.0000 | INHALATION_SPRAY | Freq: Four times a day (QID) | RESPIRATORY_TRACT | 0 refills | Status: DC | PRN

## 2022-03-07 NOTE — Progress Notes (Signed)
We are sorry that you are not feeling well.  Here is how we plan to help! ? ?Based on your presentation I believe you most likely have A cough due to allergies.  I recommend that you start the an over-the counter-allergy medication such as Claritin 10 mg or Zyrtec 10 mg daily.   ?  ?In addition you may use A prescription cough medication called Tessalon Perles 100mg . You may take 1-2 capsules every 8 hours as needed for your cough. ? ?Prednisone 20 mg twice daily for 5 days  ?We will also refill your inhaler to assure you have enough ? ?From your responses in the eVisit questionnaire you describe inflammation in the upper respiratory tract which is causing a significant cough.  This is commonly called Bronchitis and has four common causes:   ?Allergies ?Viral Infections ?Acid Reflux ?Bacterial Infection ?Allergies, viruses and acid reflux are treated by controlling symptoms or eliminating the cause. An example might be a cough caused by taking certain blood pressure medications. You stop the cough by changing the medication. Another example might be a cough caused by acid reflux. Controlling the reflux helps control the cough. ? ?USE OF BRONCHODILATOR ("RESCUE") INHALERS: ?There is a risk from using your bronchodilator too frequently.  The risk is that over-reliance on a medication which only relaxes the muscles surrounding the breathing tubes can reduce the effectiveness of medications prescribed to reduce swelling and congestion of the tubes themselves.  Although you feel brief relief from the bronchodilator inhaler, your asthma may actually be worsening with the tubes becoming more swollen and filled with mucus.  This can delay other crucial treatments, such as oral steroid medications. If you need to use a bronchodilator inhaler daily, several times per day, you should discuss this with your provider.  There are probably better treatments that could be used to keep your asthma under control.  ?   ?HOME  CARE ?Only take medications as instructed by your medical team. ?Complete the entire course of an antibiotic. ?Drink plenty of fluids and get plenty of rest. ?Avoid close contacts especially the very young and the elderly ?Cover your mouth if you cough or cough into your sleeve. ?Always remember to wash your hands ?A steam or ultrasonic humidifier can help congestion.  ? ?GET HELP RIGHT AWAY IF: ?You develop worsening fever. ?You become short of breath ?You cough up blood. ?Your symptoms persist after you have completed your treatment plan ?MAKE SURE YOU  ?Understand these instructions. ?Will watch your condition. ?Will get help right away if you are not doing well or get worse. ?  ? ?Thank you for choosing an e-visit. ? ?Your e-visit answers were reviewed by a board certified advanced clinical practitioner to complete your personal care plan. Depending upon the condition, your plan could have included both over the counter or prescription medications. ? ?Please review your pharmacy choice. Make sure the pharmacy is open so you can pick up prescription now. If there is a problem, you may contact your provider through and have the prescription routed to another pharmacy.  Your safety is important to [employer]. If you have drug allergies check your prescription carefully.  ? ?For the next 24 hours you can use MyChart to ask questions about today's visit, request a non-urgent call back, or ask for a work or school excuse. ?You will get an email in the next two days asking about your experience. I hope that your e-visit has been valuable and will speed  your recovery.  ? ?I spent approximately 5 minutes reviewing the patient's history, current symptoms and coordinating their plan of care today.   ? ? ?Meds ordered this encounter  ?Medications  ? albuterol (VENTOLIN HFA) 108 (90 Base) MCG/ACT inhaler  ?  Sig: Inhale 2 puffs into the lungs every 6 (six) hours as needed for wheezing or shortness of breath.  ?   Dispense:  8 g  ?  Refill:  0  ? predniSONE (DELTASONE) 20 MG tablet  ?  Sig: Take 1 tablet (20 mg total) by mouth 2 (two) times daily with a meal for 5 days.  ?  Dispense:  10 tablet  ?  Refill:  0  ? benzonatate (TESSALON) 100 MG capsule  ?  Sig: Take 1 capsule (100 mg total) by mouth 3 (three) times daily as needed.  ?  Dispense:  30 capsule  ?  Refill:  0  ?  ?

## 2022-04-26 ENCOUNTER — Telehealth: Payer: Medicaid Other | Admitting: Physician Assistant

## 2022-04-26 DIAGNOSIS — J4521 Mild intermittent asthma with (acute) exacerbation: Secondary | ICD-10-CM

## 2022-04-26 MED ORDER — PREDNISONE 20 MG PO TABS: 40.0000 mg | ORAL_TABLET | Freq: Every day | ORAL | 0 refills | Status: AC

## 2022-04-26 MED ORDER — BENZONATATE 100 MG PO CAPS: 100.0000 mg | ORAL_CAPSULE | Freq: Three times a day (TID) | ORAL | 0 refills | Status: DC | PRN

## 2022-04-26 MED ORDER — ALBUTEROL SULFATE HFA 108 (90 BASE) MCG/ACT IN AERS: 1.0000 | INHALATION_SPRAY | Freq: Four times a day (QID) | RESPIRATORY_TRACT | 0 refills | Status: DC | PRN

## 2022-04-26 NOTE — Progress Notes (Signed)
I have spent 5 minutes in review of e-visit questionnaire, review and updating patient chart, medical decision making and response to patient.   Jamarie Joplin Cody Eutimio Gharibian, PA-C    

## 2022-04-26 NOTE — Progress Notes (Signed)
Visit for Asthma  Based on what you have shared with me, it looks like you may have a flare up of your asthma.  Asthma is a chronic (ongoing) lung disease which results in airway obstruction, inflammation and hyper-responsiveness.   Asthma symptoms vary from person to person, with common symptoms including nighttime awakening and decreased ability to participate in normal activities as a result of shortness of breath. It is often triggered by changes in weather, changes in the season, changes in air temperature, or inside (home, school, daycare or work) allergens such as animal dander, mold, mildew, woodstoves or cockroaches.   It can also be triggered by hormonal changes, extreme emotion, physical exertion or an upper respiratory tract illness.     It is important to identify the trigger, and then eliminate or avoid the trigger if possible.   If you have been prescribed medications to be taken on a regular basis, it is important to follow the asthma action plan and to follow guidelines to adjust medication in response to increasing symptoms of decreased peak expiratory flow rate  Treatment: I have prescribed: Prednisone 40mg  by mouth per day for 5 - 7 days. I have also prescribed a cough medication to have on hand. Continue use of your albuterol inhaler.   HOME CARE Only take medications as instructed by your medical team. Consider wearing a mask or scarf to improve breathing air temperature have been shown to decrease irritation and decrease exacerbations Get rest. Taking a steamy shower or using a humidifier may help nasal congestion sand ease sore throat pain. You can place a towel over your head and breathe in the steam from hot water coming from a faucet. Using a saline nasal spray works much the same way.  Cough drops, hare candies and sore throat lozenges may ease your cough.   Avoid close contacts especially the very you and the elderly Cover your mouth if you cough or sneeze Always remember to wash your hands.    GET HELP RIGHT AWAY IF: You develop worsening symptoms; breathlessness at rest, drowsy, confused or agitated, unable to speak in full sentences You have coughing fits You develop a severe headache or visual changes You develop shortness of breath, difficulty breathing or start having chest pain Your symptoms persist after you have completed your treatment plan If your symptoms do not improve within 10 days  MAKE SURE YOU Understand these instructions. Will watch your condition. Will get help right away if you are not doing well or get worse.   Your e-visit answers were reviewed by a board certified advanced clinical practitioner to complete your personal care plan, Depending upon the condition, your plan could have included both over the counter or prescription medications.   Please review your pharmacy choice. Your safety is important to . If you have drug allergies check your prescription carefully.  You can use MyChart to ask questions about today's visit, request a non-urgent  call back, or ask for a work or school excuse for 24 hours related to this e-Visit. If it has been greater than 24 hours you will need to follow up with your provider, or enter a new e-Visit to address those concerns.   You will get an e-mail in the next two days asking about your experience. I hope that your e-visit has been valuable and will speed your recovery. Thank you for using e-visits.

## 2022-04-26 NOTE — Addendum Note (Signed)
Addended by: Brunetta Jeans on: 04/26/2022 09:21 AM   Modules accepted: Orders

## 2022-06-21 ENCOUNTER — Telehealth: Payer: Medicaid Other | Admitting: Physician Assistant

## 2022-06-21 DIAGNOSIS — J4530 Mild persistent asthma, uncomplicated: Secondary | ICD-10-CM | POA: Diagnosis not present

## 2022-06-21 NOTE — Progress Notes (Signed)
I have spent 5 minutes in review of e-visit questionnaire, review and updating patient chart, medical decision making and response to patient.   Zamaria Brazzle Cody Chennel Olivos, PA-C    

## 2022-06-21 NOTE — Progress Notes (Signed)
Visit for Asthma  Based on what you have shared with me, it looks like you may have a flare up of your asthma.  Asthma is a chronic (ongoing) lung disease which results in airway obstruction, inflammation and hyper-responsiveness.   Asthma symptoms vary from person to person, with common symptoms including nighttime awakening and decreased ability to participate in normal activities as a result of shortness of breath. It is often triggered by changes in weather, changes in the season, changes in air temperature, or inside (home, school, daycare or work) allergens such as animal dander, mold, mildew, woodstoves or cockroaches.   It can also be triggered by hormonal changes, extreme emotion, physical exertion or an upper respiratory tract illness.     It is important to identify the trigger, and then eliminate or avoid the trigger if possible.   If you have been prescribed medications to be taken on a regular basis, it is important to follow the asthma action plan and to follow guidelines to adjust medication in response to increasing symptoms of decreased peak expiratory flow rate  Treatment: I have prescribed: Albuterol (Proventil HFA; Ventolin HFA) 108 (90 Base) MCG/ACT Inhaler 2 puffs into the lungs every six hours as needed for wheezing or shortness of breath. I have sent in a refill of this, but giving how quickly you have gone through this, you need to be seen in person either with a primary care provider or at local urgent care for evaluation and to get started on some maintenance medications for asthma.   HOME CARE Only take medications as instructed by your medical team. Consider wearing a mask or scarf to improve breathing air temperature have been shown to decrease irritation and decrease exacerbations Get rest. Taking a steamy shower or using a humidifier may help nasal  congestion sand ease sore throat pain. You can place a towel over your head and breathe in the steam from hot water coming from a faucet. Using a saline nasal spray works much the same way.  Cough drops, hare candies and sore throat lozenges may ease your cough.  Avoid close contacts especially the very you and the elderly Cover your mouth if you cough or sneeze Always remember to wash your hands.    GET HELP RIGHT AWAY IF: You develop worsening symptoms; breathlessness at rest, drowsy, confused or agitated, unable to speak in full sentences You have coughing fits You develop a severe headache or visual changes You develop shortness of breath, difficulty breathing or start having chest pain Your symptoms persist after you have completed your treatment plan If your symptoms do not improve within 10 days  MAKE SURE YOU Understand these instructions. Will watch your condition. Will get help right away if you are not doing well or get worse.   Your e-visit answers were reviewed by a board certified advanced clinical practitioner to complete your personal care plan, Depending upon the condition, your plan could have included both over the counter or prescription medications.   Please review your pharmacy choice. Your safety is important to [place]. If you have drug allergies check your prescription carefully.  You can use MyChart to ask questions about today's visit, request a non-urgent  call back, or ask for a work or school excuse for 24 hours related to this e-Visit. If it has been greater than 24 hours you will need to follow up with your provider, or enter a new e-Visit to address those concerns.   You will get an e-mail  in the next two days asking about your experience. I hope that your e-visit has been valuable and will speed your recovery. Thank you for using e-visits.

## 2022-06-24 ENCOUNTER — Telehealth: Payer: Medicaid Other | Admitting: Physician Assistant

## 2022-06-24 DIAGNOSIS — J4521 Mild intermittent asthma with (acute) exacerbation: Secondary | ICD-10-CM

## 2022-06-24 MED ORDER — ALBUTEROL SULFATE HFA 108 (90 BASE) MCG/ACT IN AERS: 1.0000 | INHALATION_SPRAY | Freq: Four times a day (QID) | RESPIRATORY_TRACT | 0 refills | Status: DC | PRN

## 2022-06-24 NOTE — Progress Notes (Signed)
Patient did not get Rx from [facility] on 06/21/22. Medication resent. Visit will be marked no charge.

## 2022-06-29 ENCOUNTER — Encounter: Payer: Self-pay | Admitting: Nurse Practitioner

## 2022-06-29 ENCOUNTER — Ambulatory Visit: Payer: Medicaid Other | Admitting: Nurse Practitioner

## 2022-06-29 VITALS — BP 130/80 | HR 76 | Temp 97.5°F | Ht 62.0 in | Wt 208.0 lb

## 2022-06-29 DIAGNOSIS — J4540 Moderate persistent asthma, uncomplicated: Secondary | ICD-10-CM

## 2022-06-29 DIAGNOSIS — N8110 Cystocele, unspecified: Secondary | ICD-10-CM

## 2022-06-29 MED ORDER — FLOVENT HFA 44 MCG/ACT IN AERO: 2.0000 | INHALATION_SPRAY | Freq: Two times a day (BID) | RESPIRATORY_TRACT | 11 refills | Status: DC

## 2022-06-29 MED ORDER — CETIRIZINE HCL 10 MG PO TABS: 10.0000 mg | ORAL_TABLET | Freq: Every day | ORAL | 3 refills | Status: DC

## 2022-06-29 NOTE — Assessment & Plan Note (Signed)
Chronic, stable.  Follows with urology.  Continue collaboration recommendations from urology.

## 2022-06-29 NOTE — Patient Instructions (Signed)
It was great to see you!  Start flovent inhaler 2 puffs twice a day. Rinse your mouth out after using. Keep using your albuterol as needed and zyrtec.   Let's follow-up in 3-4 weeks, sooner if you have concerns.  If a referral was placed today, you will be contacted for an appointment. Please note that routine referrals can sometimes take up to 3-4 weeks to process. Please call our office if you haven't heard anything after this time frame.  Take care,  Rodman Pickle, NP

## 2022-06-29 NOTE — Assessment & Plan Note (Addendum)
Chronic, not controlled.  She is currently using an albuterol inhaler at least 3 times a day.  Continue her Zyrtec daily.  Will start her on Flovent 44 mcg 2 puffs twice a day.  Discussed rinsing her mouth out after use.  She can continue to use her albuterol inhaler as needed.  Encouraged her to potentially identify triggers that worsen symptoms.  Discussed getting yearly flu vaccine and a one-time pneumonia vaccine.  Follow-up in 4 weeks or sooner with concerns or worsening symptoms.

## 2022-06-29 NOTE — Progress Notes (Signed)
New Patient Visit  BP 130/80   Pulse 76   Temp (!) 97.5 F (36.4 C) (Temporal)   Ht 5\' 2"  (1.575 m)   Wt 208 lb (94.3 kg)   LMP 06/15/2022 (Approximate)   SpO2 97%   BMI 38.04 kg/m    Subjective:    Patient ID: 06/[id number], female    DOB: June 03, 1991, 31 y.o.   MRN: [id number]  CC: Chief Complaint  Patient presents with   Establish Care    Np. Est care. Asthma management    HPI: Amber Wilkins is a 31 y.o. female presents for new patient visit to establish care.  Introduced to 37 role and practice setting.  All questions answered.  Discussed provider/patient relationship and expectations.  She has a history of asthma and it is not controlled. She has been doing frequent e-visits. She has been using her albuterol inhaler very frequently, at least 3 times a day. She wakes up at least once at night and uses her inhaler. She is also taking zyrtec at night which is helping with her symptoms. She noticed chest tightness and wheezing. She states that today is a good day for her symptoms.   She has a history of bladder prolapse. She currently follows with urology.   Past Medical History:  Diagnosis Date   Asthma    Bronchitis, allergic    Female bladder prolapse    Seasonal allergies     Past Surgical History:  Procedure Laterality Date   APPENDECTOMY  11/21/2000    Family History  Problem Relation Age of Onset   Seizures Mother    Ovarian cancer Mother    Cervical cancer Mother    Lupus Mother    Kidney disease Mother    Hypertension Father    Alcohol abuse Father    Hypertension Paternal Grandmother    Stroke Paternal Grandfather    Hypertension Paternal Grandfather    Heart disease Maternal Grandmother      Social History   Tobacco Use   Smoking status: Former    Packs/day: 0.00    Years: 1.00    Total pack years: 0.00    Types: Cigarettes, E-cigarettes    Quit date: 07/12/2013    Years since quitting: 8.9   Smokeless tobacco: Never   Vaping Use   Vaping Use: Former  Substance Use Topics   Alcohol use: No   Drug use: Not Currently    Types: Marijuana    Comment: august 2014    Current Outpatient Medications on File Prior to Visit  Medication Sig Dispense Refill   albuterol (VENTOLIN HFA) 108 (90 Base) MCG/ACT inhaler Inhale 1-2 puffs into the lungs every 6 (six) hours as needed for wheezing or shortness of breath. 8 g 0   No current facility-administered medications on file prior to visit.     Review of Systems  Constitutional: Negative.   HENT: Negative.    Respiratory:  Positive for chest tightness, shortness of breath and wheezing.   Cardiovascular: Negative.   Gastrointestinal: Negative.   Genitourinary: Negative.   Musculoskeletal: Negative.   Neurological: Negative.   Psychiatric/Behavioral: Negative.        Objective:    BP 130/80   Pulse 76   Temp (!) 97.5 F (36.4 C) (Temporal)   Ht 5\' 2"  (1.575 m)   Wt 208 lb (94.3 kg)   LMP 06/15/2022 (Approximate)   SpO2 97%   BMI 38.04 kg/m   Wt Readings from Last 3  Encounters:  06/29/22 208 lb (94.3 kg)  03/16/21 198 lb (89.8 kg)  03/10/21 198 lb (89.8 kg)    BP Readings from Last 3 Encounters:  06/29/22 130/80  04/21/21 (!) 147/93  03/16/21 123/85    Physical Exam Vitals and nursing note reviewed.  Constitutional:      General: She is not in acute distress.    Appearance: Normal appearance.  HENT:     Head: Normocephalic.  Eyes:     Conjunctiva/sclera: Conjunctivae normal.  Cardiovascular:     Rate and Rhythm: Normal rate and regular rhythm.     Pulses: Normal pulses.     Heart sounds: Normal heart sounds.  Pulmonary:     Effort: Pulmonary effort is normal.     Breath sounds: Normal breath sounds.  Musculoskeletal:     Cervical back: Normal range of motion.  Skin:    General: Skin is warm.  Neurological:     General: No focal deficit present.     Mental Status: She is alert and oriented to person, place, and time.   Psychiatric:        Mood and Affect: Mood normal.        Behavior: Behavior normal.        Thought Content: Thought content normal.        Judgment: Judgment normal.        Assessment & Plan:   Problem List Items Addressed This Visit       Respiratory   Moderate persistent asthma without complication - Primary    Chronic, not controlled.  She is currently using an albuterol inhaler at least 3 times a day.  Continue her Zyrtec daily.  Will start her on Flovent 44 mcg 2 puffs twice a day.  Discussed rinsing her mouth out after use.  She can continue to use her albuterol inhaler as needed.  Encouraged her to potentially identify triggers that worsen symptoms.  Discussed getting yearly flu vaccine and a one-time pneumonia vaccine.  Follow-up in 4 weeks or sooner with concerns or worsening symptoms.      Relevant Medications   fluticasone (FLOVENT HFA) 44 MCG/ACT inhaler     Genitourinary   Female bladder prolapse    Chronic, stable.  Follows with urology.  Continue collaboration recommendations from urology.        Follow up plan: Return in about 4 weeks (around 07/27/2022) for asthma.

## 2022-08-03 ENCOUNTER — Ambulatory Visit: Payer: Medicaid Other | Admitting: Nurse Practitioner

## 2022-08-08 ENCOUNTER — Ambulatory Visit: Payer: Medicaid Other | Admitting: Nurse Practitioner

## 2022-08-08 ENCOUNTER — Encounter: Payer: Self-pay | Admitting: Nurse Practitioner

## 2022-08-08 VITALS — BP 135/79 | HR 74 | Temp 98.0°F | Wt 209.0 lb

## 2022-08-08 DIAGNOSIS — R29898 Other symptoms and signs involving the musculoskeletal system: Secondary | ICD-10-CM

## 2022-08-08 DIAGNOSIS — J4540 Moderate persistent asthma, uncomplicated: Secondary | ICD-10-CM

## 2022-08-08 NOTE — Assessment & Plan Note (Signed)
Discussed using good posture while at a computer.  Stretches given to do daily.  She can take ibuprofen or Tylenol as needed for pain.  Follow-up if symptoms worsen.

## 2022-08-08 NOTE — Progress Notes (Signed)
Established Patient Office Visit  Subjective   Patient ID: Amber Wilkins, female    DOB: 07-01-1991  Age: 31 y.o. MRN: [id number]  Chief Complaint  Patient presents with   Follow-up    F/U inhalers.    HPI  Amber Wilkins is here to follow-up on asthma.  She started Flovent inhaler and states that her symptoms are improving.  She states that she noticed a headache the first few times she did the inhaler and has just been taking it at night.  She has decreased on her albuterol inhaler use.  She does have some slight congestion and postnasal drip.  She is taking her Zyrtec 10 mg daily.  She denies shortness of breath.  She does endorse wheezing intermittently still, however it is better than it was before.  She has been experiencing some tightness in her shoulders and neck.  She states that she was afraid for COVID at first because this is how it started in the past, however she did not develop any further symptoms.  She states that the pain is not bad enough to need Tylenol or ibuprofen.  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    ROS See pertinent positives and negatives per HPI.    Objective:     BP 135/79 (BP Location: Left Arm, Patient Position: Sitting, Cuff Size: Normal)   Pulse 74   Temp 98 F (36.7 C) (Oral)   Wt 209 lb (94.8 kg)   SpO2 98%   BMI 38.23 kg/m    Physical Exam Vitals and nursing note reviewed.  Constitutional:      General: She is not in acute distress.    Appearance: Normal appearance.  HENT:     Head: Normocephalic.  Eyes:     Conjunctiva/sclera: Conjunctivae normal.  Cardiovascular:     Rate and Rhythm: Normal rate and regular rhythm.     Pulses: Normal pulses.     Heart sounds: Normal heart sounds.  Pulmonary:     Effort: Pulmonary effort is normal.     Breath sounds: Normal breath sounds.  Musculoskeletal:     Cervical back: Normal range of motion.  Skin:    General: Skin is warm.  Neurological:      General: No focal deficit present.     Mental Status: She is alert and oriented to person, place, and time.  Psychiatric:        Mood and Affect: Mood normal.        Behavior: Behavior normal.        Thought Content: Thought content normal.        Judgment: Judgment normal.       Assessment & Plan:   Problem List Items Addressed This Visit       Respiratory   Moderate persistent asthma without complication - Primary    Chronic, improving.  Continue Flovent inhaler at least daily, if not twice a day.  She can continue to use the albuterol inhaler as needed.  Continue Zyrtec 10 mg daily.  Discussed the possibility of adding on montelukast, however she would like to hold off for now.  Follow-up in 3 months, sooner with concerns.        Other   Neck tightness    Discussed using good posture while at a computer.  Stretches given to do daily.  She can take ibuprofen or Tylenol as needed for pain.  Follow-up if symptoms worsen.  Return in about 3 months (around 11/07/2022) for CPE.    Gerre Scull, NP

## 2022-08-08 NOTE — Assessment & Plan Note (Signed)
Chronic, improving.  Continue Flovent inhaler at least daily, if not twice a day.  She can continue to use the albuterol inhaler as needed.  Continue Zyrtec 10 mg daily.  Discussed the possibility of adding on montelukast, however she would like to hold off for now.  Follow-up in 3 months, sooner with concerns.

## 2022-08-08 NOTE — Patient Instructions (Signed)
It was great to see you!  Continue your flovent inhaler.  I have attached some stretches for your neck and shoulders.   Let's follow-up in 3 months, sooner if you have concerns.  If a referral was placed today, you will be contacted for an appointment. Please note that routine referrals can sometimes take up to 3-4 weeks to process. Please call our office if you haven't heard anything after this time frame.  Take care,  Vance Peper, NP

## 2022-08-16 ENCOUNTER — Ambulatory Visit: Payer: Medicaid Other | Admitting: Nurse Practitioner

## 2022-08-16 ENCOUNTER — Ambulatory Visit: Payer: Medicaid Other

## 2022-08-16 ENCOUNTER — Encounter: Payer: Self-pay | Admitting: Nurse Practitioner

## 2022-08-16 VITALS — BP 124/84 | HR 72 | Temp 97.6°F | Wt 212.6 lb

## 2022-08-16 DIAGNOSIS — Z111 Encounter for screening for respiratory tuberculosis: Secondary | ICD-10-CM

## 2022-08-16 DIAGNOSIS — Z1159 Encounter for screening for other viral diseases: Secondary | ICD-10-CM

## 2022-08-16 DIAGNOSIS — Z0000 Encounter for general adult medical examination without abnormal findings: Secondary | ICD-10-CM | POA: Diagnosis not present

## 2022-08-16 DIAGNOSIS — Z13220 Encounter for screening for lipoid disorders: Secondary | ICD-10-CM | POA: Diagnosis not present

## 2022-08-16 DIAGNOSIS — J4540 Moderate persistent asthma, uncomplicated: Secondary | ICD-10-CM | POA: Diagnosis not present

## 2022-08-16 LAB — CBC
HCT: 41.9 % (ref 36.0–46.0)
Hemoglobin: 14.5 g/dL (ref 12.0–15.0)
MCHC: 34.7 g/dL (ref 30.0–36.0)
MCV: 91.5 fl (ref 78.0–100.0)
Platelets: 312 10*3/uL (ref 150.0–400.0)
RBC: 4.58 Mil/uL (ref 3.87–5.11)
RDW: 12.3 % (ref 11.5–15.5)
WBC: 10.8 10*3/uL — ABNORMAL HIGH (ref 4.0–10.5)

## 2022-08-16 LAB — COMPREHENSIVE METABOLIC PANEL
ALT: 53 U/L — ABNORMAL HIGH (ref 0–35)
AST: 28 U/L (ref 0–37)
Albumin: 4.3 g/dL (ref 3.5–5.2)
Alkaline Phosphatase: 67 U/L (ref 39–117)
BUN: 7 mg/dL (ref 6–23)
CO2: 29 mEq/L (ref 19–32)
Calcium: 9.5 mg/dL (ref 8.4–10.5)
Chloride: 103 mEq/L (ref 96–112)
Creatinine, Ser: 0.77 mg/dL (ref 0.40–1.20)
GFR: 103.98 mL/min (ref >60.00)
Glucose, Bld: 93 mg/dL (ref 70–99)
Potassium: 4.1 mEq/L (ref 3.5–5.1)
Sodium: 137 mEq/L (ref 135–145)
Total Bilirubin: 0.7 mg/dL (ref 0.2–1.2)
Total Protein: 7.5 g/dL (ref 6.0–8.3)

## 2022-08-16 LAB — LIPID PANEL
Cholesterol: 179 mg/dL (ref 0–200)
HDL: 42.8 mg/dL (ref >39.00)
LDL Cholesterol: 101 mg/dL — ABNORMAL HIGH (ref 0–99)
NonHDL: 135.78
Total CHOL/HDL Ratio: 4
Triglycerides: 173 mg/dL — ABNORMAL HIGH (ref 0.0–149.0)
VLDL: 34.6 mg/dL (ref 0.0–40.0)

## 2022-08-16 NOTE — Progress Notes (Signed)
Established Patient Office Visit  Subjective   Patient ID: Amber Wilkins, female    DOB: Oct 06, 1991  Age: 31 y.o. MRN: [id number]  Chief Complaint  Patient presents with   Follow-up    Pt here to get her health screening complete    HPI  Amber Wilkins is here to get a health screening form completed for a new job.  She is also okay with getting her physical today.  She has no concerns.  Past Medical History:  Diagnosis Date   Asthma    Bronchitis, allergic    Female bladder prolapse    Seasonal allergies    Past Surgical History:  Procedure Laterality Date   APPENDECTOMY  11/21/2000   Social History   Tobacco Use   Smoking status: Former    Packs/day: 0.00    Years: 1.00    Total pack years: 0.00    Types: Cigarettes, E-cigarettes    Quit date: 07/12/2013    Years since quitting: 9.1   Smokeless tobacco: Never  Vaping Use   Vaping Use: Former  Substance Use Topics   Alcohol use: No   Drug use: Not Currently    Types: Marijuana    Comment: august 2014   Current Outpatient Medications on File Prior to Visit  Medication Sig Dispense Refill   albuterol (VENTOLIN HFA) 108 (90 Base) MCG/ACT inhaler Inhale 1-2 puffs into the lungs every 6 (six) hours as needed for wheezing or shortness of breath. 8 g 0   cetirizine (ZYRTEC) 10 MG tablet Take 1 tablet (10 mg total) by mouth daily. 90 tablet 3   fluticasone (FLOVENT HFA) 44 MCG/ACT inhaler Inhale 2 puffs into the lungs 2 (two) times daily. 10.6 g 11   No current facility-administered medications on file prior to visit.     Review of Systems  Constitutional: Negative.   HENT: Negative.    Respiratory: Negative.    Cardiovascular: Negative.   Gastrointestinal: Negative.   Genitourinary: Negative.   Musculoskeletal: Negative.   Skin: Negative.   Neurological: Negative.   Psychiatric/Behavioral: Negative.       Objective:     BP 124/84   Pulse 72   Temp 97.6 F (36.4 C) (Temporal)   Wt 212 lb 9.6 oz  (96.4 kg)   SpO2 97%   BMI 38.89 kg/m    Physical Exam Vitals and nursing note reviewed.  Constitutional:      General: She is not in acute distress.    Appearance: Normal appearance.  HENT:     Head: Normocephalic and atraumatic.     Right Ear: Tympanic membrane, ear canal and external ear normal.     Left Ear: Tympanic membrane, ear canal and external ear normal.  Eyes:     Conjunctiva/sclera: Conjunctivae normal.  Cardiovascular:     Rate and Rhythm: Normal rate and regular rhythm.     Pulses: Normal pulses.     Heart sounds: Normal heart sounds.  Pulmonary:     Effort: Pulmonary effort is normal.     Breath sounds: Normal breath sounds.  Abdominal:     Palpations: Abdomen is soft.     Tenderness: There is no abdominal tenderness.  Musculoskeletal:        General: Normal range of motion.     Cervical back: Normal range of motion and neck supple.     Right lower leg: No edema.     Left lower leg: No edema.  Lymphadenopathy:     Cervical:  No cervical adenopathy.  Skin:    General: Skin is warm and dry.  Neurological:     General: No focal deficit present.     Mental Status: She is alert and oriented to person, place, and time.     Cranial Nerves: No cranial nerve deficit.     Coordination: Coordination normal.     Gait: Gait normal.  Psychiatric:        Mood and Affect: Mood normal.        Behavior: Behavior normal.        Thought Content: Thought content normal.        Judgment: Judgment normal.      Assessment & Plan:   Problem List Items Addressed This Visit       Respiratory   Moderate persistent asthma without complication    Chronic, stable.  Continue Flovent daily and albuterol as needed.  She is not needing refills at this time.  She declines Prevnar 20 and flu vaccine today.  Follow-up in 6 months.      Other Visit Diagnoses     Routine general medical examination at a health care facility    -  Primary   Health maintenance reviewed and  updated.  Declines Pneumovax and flu today.  Discussed nutrition, exercise.  Follow-up 1 year   Relevant Orders   CBC   Comprehensive metabolic panel   Screening-pulmonary TB       TB skin test done for new employment.   Screening, lipid       Screen baseline lipid panel today   Relevant Orders   Lipid panel   Encounter for hepatitis C screening test for low risk patient       Screen hepatitis C today   Relevant Orders   Hepatitis C antibody      LABORATORY TESTING:  - Pap smear: up to date  IMMUNIZATIONS:   - Tdap: Tetanus vaccination status reviewed: last tetanus booster within 10 years. - Influenza: Refused - Pneumovax: Not applicable - Prevnar: Refused - HPV: Refused - Zostavax vaccine: Not applicable  SCREENING: -Mammogram: Not applicable  - Colonoscopy: Not applicable  - Bone Density: Not applicable  -Hearing Test: Not applicable  -Spirometry: Not applicable   PATIENT COUNSELING:   Advised to take 1 mg of folate supplement per day if capable of pregnancy.   Sexuality: Discussed sexually transmitted diseases, partner selection, use of condoms, avoidance of unintended pregnancy  and contraceptive alternatives.   Advised to avoid cigarette smoking.  I discussed with the patient that most people either abstain from alcohol or drink within safe limits (<=14/week and <=4 drinks/occasion for males, <=7/weeks and <= 3 drinks/occasion for females) and that the risk for alcohol disorders and other health effects rises proportionally with the number of drinks per week and how often a drinker exceeds daily limits.  Discussed cessation/primary prevention of drug use and availability of treatment for abuse.   Diet: Encouraged to adjust caloric intake to maintain  or achieve ideal body weight, to reduce intake of dietary saturated fat and total fat, to limit sodium intake by avoiding high sodium foods and not adding table salt, and to maintain adequate dietary potassium and  calcium preferably from fresh fruits, vegetables, and low-fat dairy products.    stressed the importance of regular exercise  Injury prevention: Discussed safety belts, safety helmets, smoke detector, smoking near bedding or upholstery.   Dental health: Discussed importance of regular tooth brushing, flossing, and dental visits.  NEXT PREVENTATIVE PHYSICAL DUE IN 1 YEAR.   Return in about 6 months (around 02/14/2023) for asthma.    Gerre Scull, NP

## 2022-08-16 NOTE — Patient Instructions (Signed)
It was great to see you!  We are checking your labs today and will let you know the results via mychart/phone.   Congrats on the new job!  Let's follow-up in 6 months, sooner if you have concerns.  If a referral was placed today, you will be contacted for an appointment. Please note that routine referrals can sometimes take up to 3-4 weeks to process. Please call our office if you haven't heard anything after this time frame.  Take care,  Vance Peper, NP

## 2022-08-16 NOTE — Assessment & Plan Note (Signed)
Chronic, stable.  Continue Flovent daily and albuterol as needed.  She is not needing refills at this time.  She declines Prevnar 20 and flu vaccine today.  Follow-up in 6 months.

## 2022-08-17 LAB — HEPATITIS C ANTIBODY: Hepatitis C Ab: NONREACTIVE

## 2022-08-18 ENCOUNTER — Ambulatory Visit: Payer: Medicaid Other

## 2022-09-12 ENCOUNTER — Other Ambulatory Visit: Payer: Self-pay | Admitting: Nurse Practitioner

## 2022-09-12 DIAGNOSIS — J4521 Mild intermittent asthma with (acute) exacerbation: Secondary | ICD-10-CM

## 2022-09-13 MED ORDER — ALBUTEROL SULFATE HFA 108 (90 BASE) MCG/ACT IN AERS: 1.0000 | INHALATION_SPRAY | Freq: Four times a day (QID) | RESPIRATORY_TRACT | 0 refills | Status: DC | PRN

## 2022-11-07 ENCOUNTER — Encounter: Payer: Medicaid Other | Admitting: Nurse Practitioner

## 2023-01-17 ENCOUNTER — Other Ambulatory Visit: Payer: Self-pay | Admitting: Nurse Practitioner

## 2023-01-17 DIAGNOSIS — J4521 Mild intermittent asthma with (acute) exacerbation: Secondary | ICD-10-CM

## 2023-01-18 ENCOUNTER — Other Ambulatory Visit: Payer: Self-pay | Admitting: Nurse Practitioner

## 2023-01-18 ENCOUNTER — Other Ambulatory Visit (HOSPITAL_COMMUNITY): Payer: Self-pay

## 2023-01-18 DIAGNOSIS — J4521 Mild intermittent asthma with (acute) exacerbation: Secondary | ICD-10-CM

## 2023-01-18 MED ORDER — ALBUTEROL SULFATE HFA 108 (90 BASE) MCG/ACT IN AERS
1.0000 | INHALATION_SPRAY | Freq: Four times a day (QID) | RESPIRATORY_TRACT | 1 refills | Status: DC | PRN
  Filled 2023-01-18: qty 18, 25d supply, fill #0

## 2023-01-18 MED ORDER — ALBUTEROL SULFATE HFA 108 (90 BASE) MCG/ACT IN AERS
1.0000 | INHALATION_SPRAY | Freq: Four times a day (QID) | RESPIRATORY_TRACT | 0 refills | Status: DC | PRN
  Filled 2023-01-18: qty 6.7, 30d supply, fill #0

## 2023-01-18 MED ORDER — ALBUTEROL SULFATE HFA 108 (90 BASE) MCG/ACT IN AERS
1.0000 | INHALATION_SPRAY | Freq: Four times a day (QID) | RESPIRATORY_TRACT | 0 refills | Status: DC | PRN
  Filled 2023-01-18: qty 13.4, fill #0

## 2023-01-19 IMAGING — US US PELVIS COMPLETE WITH TRANSVAGINAL
1 series · 15 of 25 positions shown · non-contrast
Comparison: None

CLINICAL DATA: Pelvic pain



[Series 1: us pelvis complete with transvaginal · 15 of 91 slices shown]
[im 1/91]
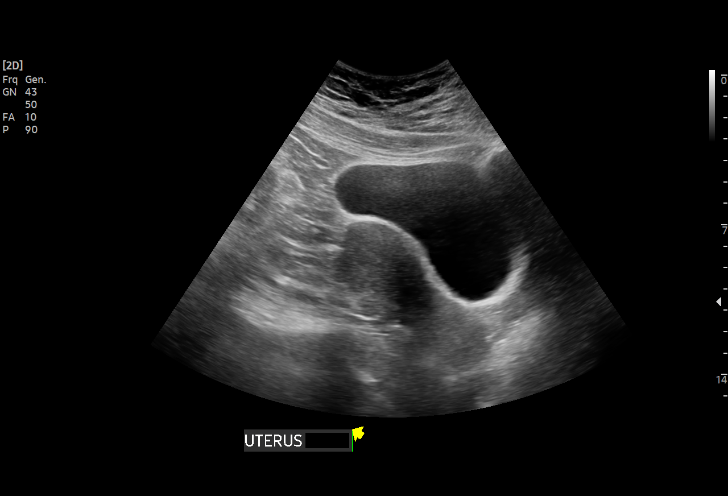
[im 8/91]
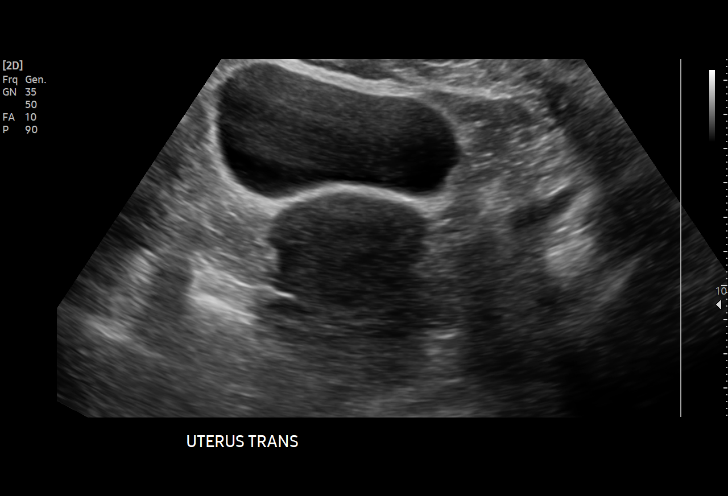
[im 16/91]
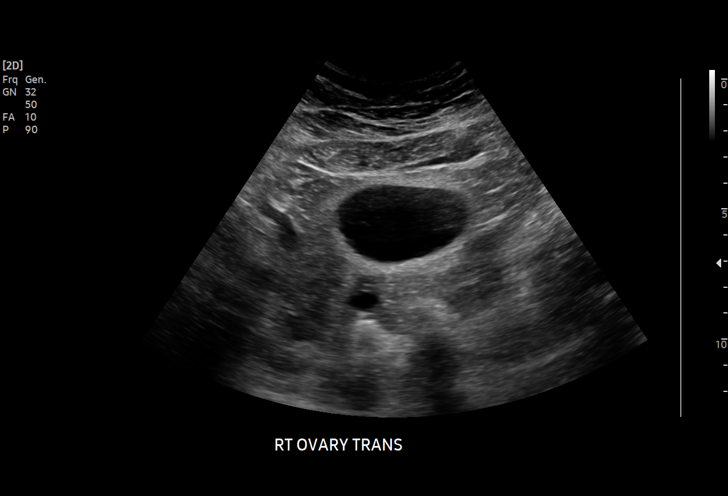
[im 19/91]
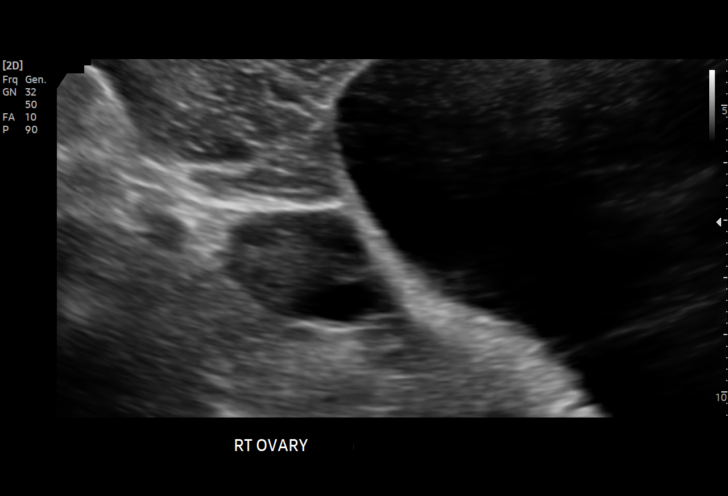
[im 27/91]
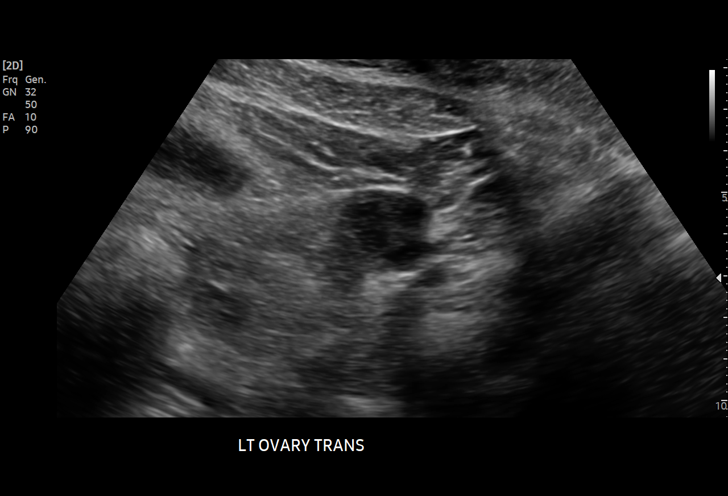
[im 34/91]
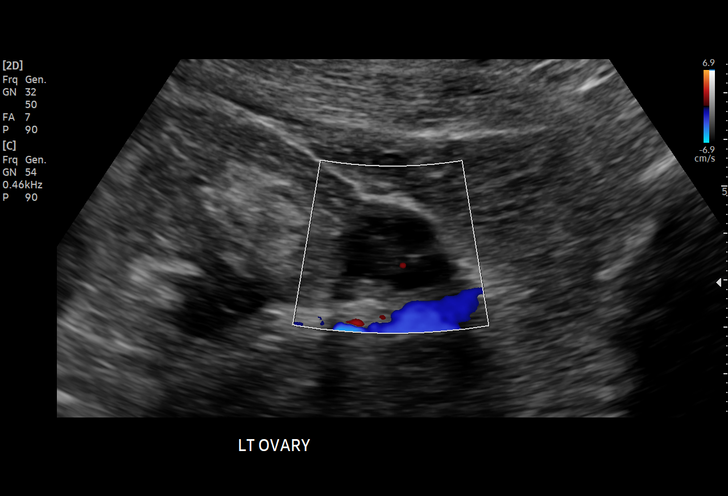
[im 38/91]
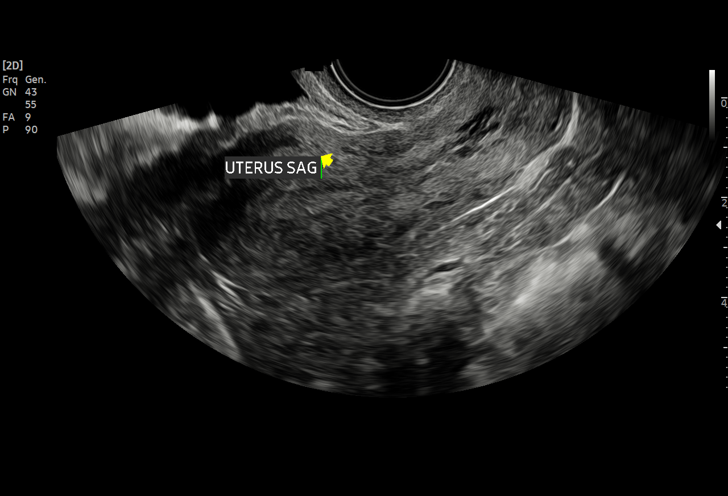
[im 46/91]
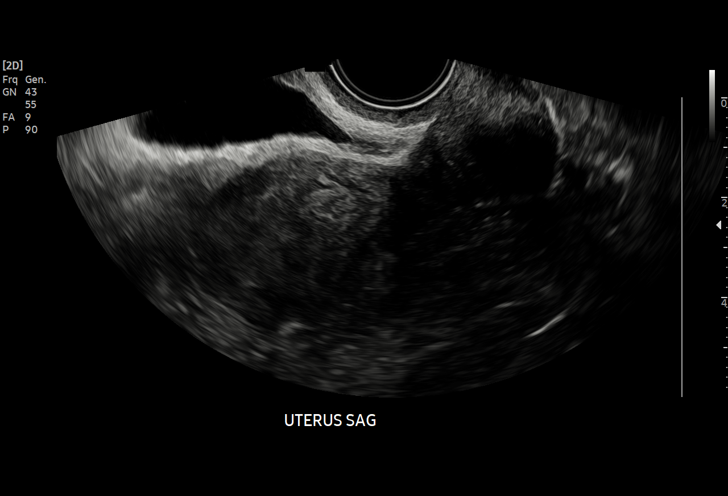
[im 53/91]
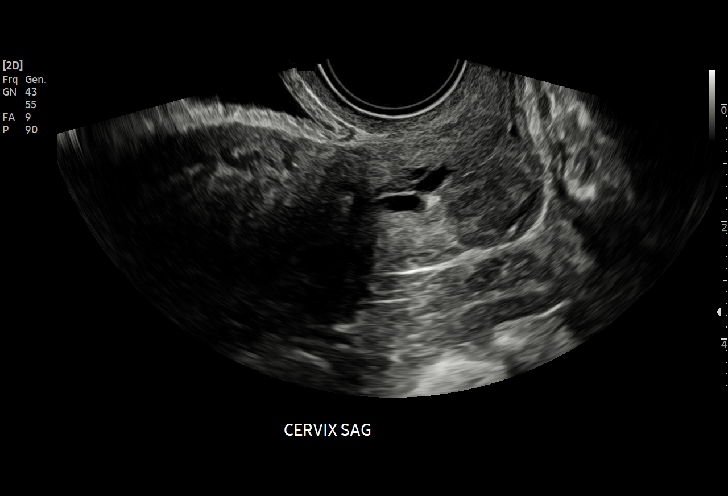
[im 57/91]
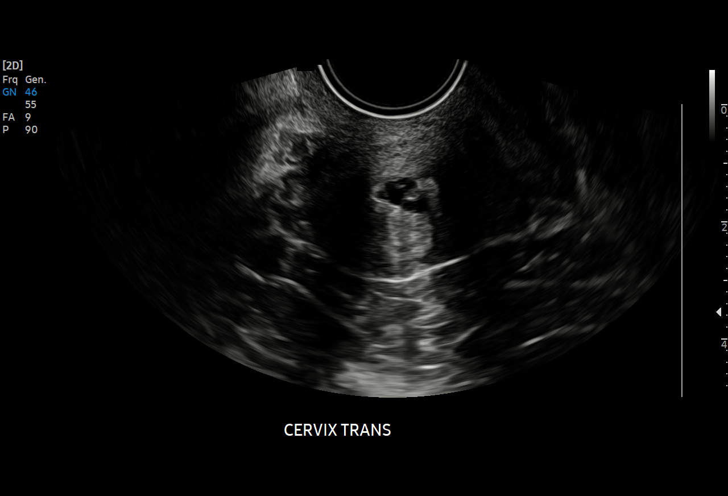
[im 64/91]
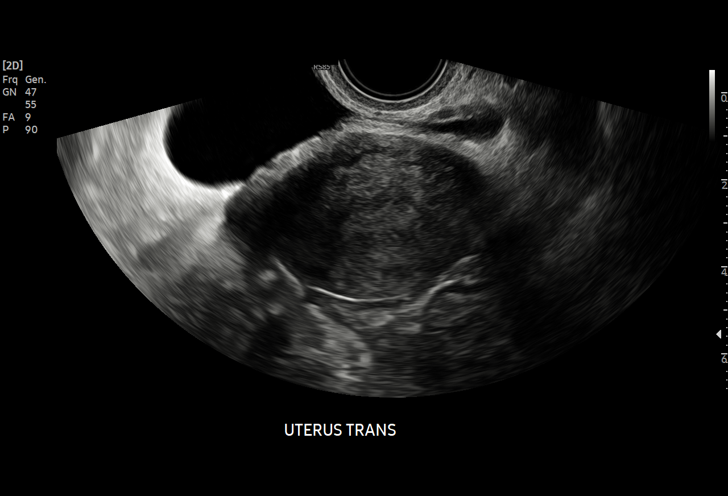
[im 72/91]
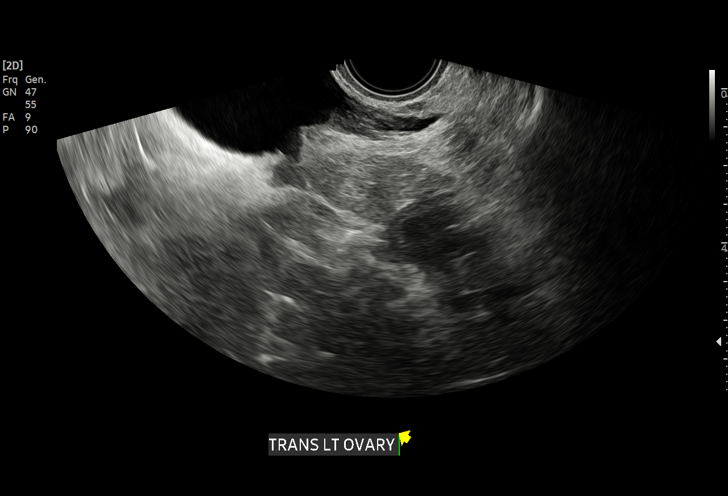
[im 76/91]
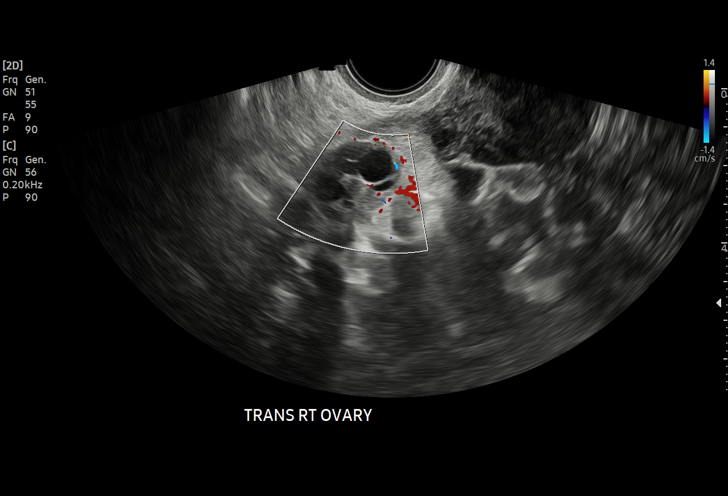
[im 83/91]
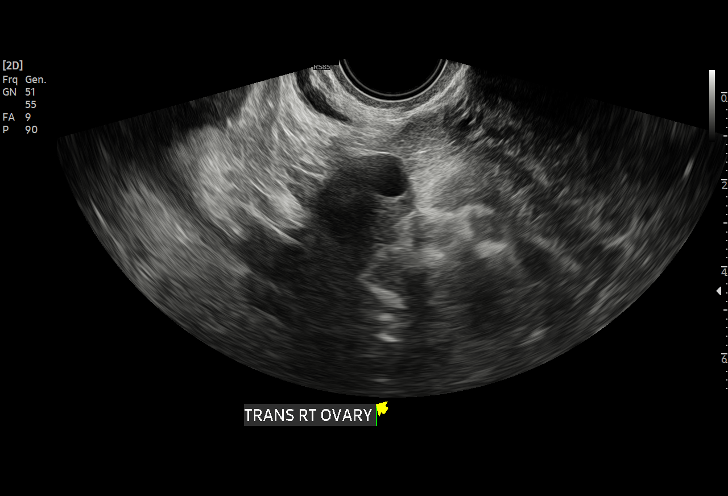
[im 91/91]
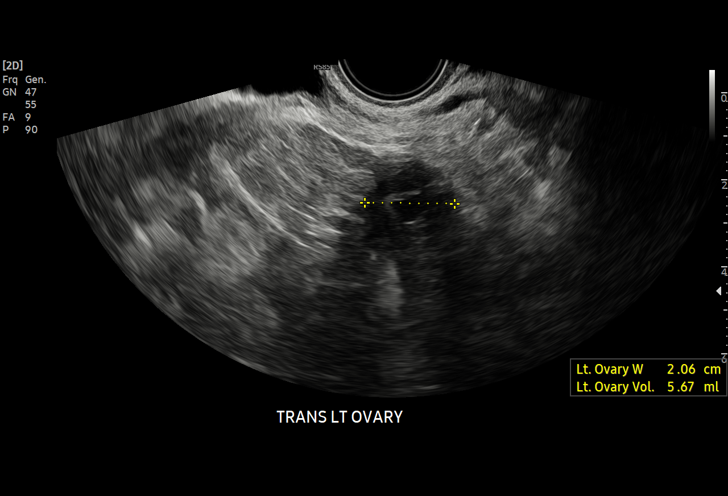

[15 of 25 positions shown; findings below may reference images not displayed]

FINDINGS: Uterus

Measurements: 7.9 x 3.4 x 4.7 cm = volume: 66 mL. No fibroids or
other mass visualized.

Endometrium

Thickness: 5 mm.  No focal abnormality visualized.

Right ovary

Measurements: 2.8 x 2.1 x 2.3 cm = volume: 7 mL. Normal
appearance/no adnexal mass.

Left ovary

Measurements: 2.8 x 1.9 x 2.1 cm = volume: 6 mL. Normal
appearance/no adnexal mass.

Other findings

No abnormal free fluid.
IMPRESSION: Unremarkable examination.

## 2023-02-13 ENCOUNTER — Ambulatory Visit: Payer: Medicaid Other | Admitting: Nurse Practitioner

## 2023-02-13 ENCOUNTER — Encounter: Payer: Self-pay | Admitting: Nurse Practitioner

## 2023-02-13 VITALS — BP 118/84 | HR 80 | Temp 98.2°F | Ht 62.0 in | Wt 209.0 lb

## 2023-02-13 DIAGNOSIS — J4540 Moderate persistent asthma, uncomplicated: Secondary | ICD-10-CM | POA: Diagnosis not present

## 2023-02-13 MED ORDER — ALBUTEROL SULFATE HFA 108 (90 BASE) MCG/ACT IN AERS: 1.0000 | INHALATION_SPRAY | Freq: Four times a day (QID) | RESPIRATORY_TRACT | 1 refills | Status: DC | PRN

## 2023-02-13 MED ORDER — FLUTICASONE PROPIONATE HFA 110 MCG/ACT IN AERO: 2.0000 | INHALATION_SPRAY | Freq: Two times a day (BID) | RESPIRATORY_TRACT | 12 refills | Status: DC

## 2023-02-13 NOTE — Progress Notes (Signed)
   Established Patient Office Visit  Subjective   Patient ID: Amber Wilkins, female    DOB: 07/25/1991  Age: 32 y.o. MRN: [id number]  Chief Complaint  Patient presents with   Asthma    Follow up, no concerns    HPI  Amber Wilkins is here to follow-up on asthma.  She states that her breathing is getting a little bit better, however she is still needing to use her albuterol inhaler every 2-3 nights.  She states that her breathing tends to be worse at night and she will wake up about 20 minutes after she goes to bed short of breath and wheezing.  She states that it was slightly worse recently as she is getting over a cold.  She is using the Flovent inhaler twice a day and the albuterol inhaler every 2 to 3 days.  She currently denies chest pain, shortness of breath, wheezing.  She is taking Zyrtec daily for her allergies.    ROS See pertinent positives and negatives per HPI.    Objective:     BP 118/84 (BP Location: Right Arm, Cuff Size: Large)   Pulse 80   Temp 98.2 F (36.8 C)   Ht 5\' 2"  (1.575 m)   Wt 209 lb (94.8 kg)   LMP 01/17/2023 (Approximate) Comment: Irregular  SpO2 97%   BMI 38.23 kg/m    Physical Exam Vitals and nursing note reviewed.  Constitutional:      General: She is not in acute distress.    Appearance: Normal appearance.  HENT:     Head: Normocephalic.  Eyes:     Conjunctiva/sclera: Conjunctivae normal.  Cardiovascular:     Rate and Rhythm: Normal rate and regular rhythm.     Pulses: Normal pulses.     Heart sounds: Normal heart sounds.  Pulmonary:     Effort: Pulmonary effort is normal.     Breath sounds: Normal breath sounds.  Musculoskeletal:     Cervical back: Normal range of motion.  Skin:    General: Skin is warm.  Neurological:     General: No focal deficit present.     Mental Status: She is alert and oriented to person, place, and time.  Psychiatric:        Mood and Affect: Mood normal.        Behavior: Behavior normal.         Thought Content: Thought content normal.        Judgment: Judgment normal.      Assessment & Plan:   Problem List Items Addressed This Visit       Respiratory   Moderate persistent asthma without complication - Primary    Chronic, not controlled.  She is still using her albuterol inhaler every 2-3 nights and waking up at night short of breath and wheezing.  Will have her increase her Flovent to 110 mcg 2 puffs twice a day.  She can continue to use her albuterol inhaler as needed.  Reminded her to rinse out her mouth or brush her teeth after using the Flovent.  She declines pneumonia and flu vaccines today.  Follow-up in 2 to 3 months or sooner with concerns.      Relevant Medications   fluticasone (FLOVENT HFA) 110 MCG/ACT inhaler   albuterol (VENTOLIN HFA) 108 (90 Base) MCG/ACT inhaler    Return in about 3 months (around 05/16/2023) for asthma.    Charyl Dancer, NP

## 2023-02-13 NOTE — Patient Instructions (Signed)
It was great to see you!  We are increasing the strength of your flovent inhaler. Keep doing 2 puffs twice a day.   Keep using your albuterol inhaler as needed.   Let's follow-up in 2-3 months, sooner if you have concerns.  If a referral was placed today, you will be contacted for an appointment. Please note that routine referrals can sometimes take up to 3-4 weeks to process. Please call our office if you haven't heard anything after this time frame.  Take care,  Vance Peper, NP

## 2023-02-13 NOTE — Assessment & Plan Note (Signed)
Chronic, not controlled.  She is still using her albuterol inhaler every 2-3 nights and waking up at night short of breath and wheezing.  Will have her increase her Flovent to 110 mcg 2 puffs twice a day.  She can continue to use her albuterol inhaler as needed.  Reminded her to rinse out her mouth or brush her teeth after using the Flovent.  She declines pneumonia and flu vaccines today.  Follow-up in 2 to 3 months or sooner with concerns.

## 2023-02-14 ENCOUNTER — Ambulatory Visit: Payer: Medicaid Other | Admitting: Nurse Practitioner

## 2023-02-27 ENCOUNTER — Ambulatory Visit: Payer: Medicaid Other | Admitting: Nurse Practitioner

## 2023-04-18 ENCOUNTER — Telehealth: Payer: Self-pay

## 2023-04-18 NOTE — Telephone Encounter (Signed)
Patient called and said that she had sent a Mychart message and also missed a call from you. I did not see anything in computer.

## 2023-04-19 ENCOUNTER — Encounter: Payer: Self-pay | Admitting: Nurse Practitioner

## 2023-04-19 ENCOUNTER — Ambulatory Visit: Payer: Medicaid Other | Admitting: Nurse Practitioner

## 2023-04-19 VITALS — BP 120/82 | HR 59 | Temp 97.3°F | Ht 62.0 in | Wt 206.0 lb

## 2023-04-19 DIAGNOSIS — R42 Dizziness and giddiness: Secondary | ICD-10-CM | POA: Insufficient documentation

## 2023-04-19 NOTE — Progress Notes (Signed)
Acute Office Visit  Subjective:     Patient ID: Amber Wilkins, female    DOB: 10-08-1991, 32 y.o.   MRN: [id number]  Chief Complaint  Patient presents with   Fainting Episode    Lightheaded, low heart rate    HPI Patient is in today for light-headed episode and feeling like she might almost faint.   She states that she had stopped smoking, but then bought a pack of cigarettes this past weekend. She smoked 4 cigarettes on Wednesday. She ended up smoking 2 cigarettes back to back and then all of a sudden got light- headed, clammy, and felt like she was going to pass out. Her hearing decreased and she went inside to lay down. Her friend got her a cool compress. As soon as she put her feet up and got the cool compress her symptoms went away. She states that she did feel fatigued after and still has some fatigue today. She states that she had a slight fluttering sensation in her chest. She did eat 1.5 donuts that morning but usually she has been intermittent fasting and not eating until after she gets out of work at [employer]. She states that she had a syncopal episode when she was 32 years old, but has not had any issues since. She denies any new medications.   ROS See pertinent positives and negatives per HPI.     Objective:    BP 120/82 (BP Location: Left Arm)   Pulse (!) 59   Temp (!) 97.3 F (36.3 C)   Ht 5\' 2"  (1.575 m)   Wt 206 lb (93.4 kg)   LMP 04/16/2023 (Exact Date)   SpO2 98%   BMI 37.68 kg/m    Physical Exam Vitals and nursing note reviewed.  Constitutional:      General: She is not in acute distress.    Appearance: Normal appearance.  HENT:     Head: Normocephalic.  Eyes:     Extraocular Movements: Extraocular movements intact.     Conjunctiva/sclera: Conjunctivae normal.  Cardiovascular:     Rate and Rhythm: Normal rate and regular rhythm.     Pulses: Normal pulses.     Heart sounds: Normal heart sounds.  Pulmonary:     Effort: Pulmonary effort is normal.      Breath sounds: Normal breath sounds.  Abdominal:     Palpations: Abdomen is soft.     Tenderness: There is no abdominal tenderness.  Musculoskeletal:     Cervical back: Normal range of motion.     Comments: Strength 5/5 in bilateral upper and lower extremities  Skin:    General: Skin is warm.  Neurological:     General: No focal deficit present.     Mental Status: She is alert and oriented to person, place, and time.     Cranial Nerves: No cranial nerve deficit.     Motor: No weakness.  Psychiatric:        Mood and Affect: Mood normal.        Behavior: Behavior normal.        Thought Content: Thought content normal.        Judgment: Judgment normal.      Assessment & Plan:   Problem List Items Addressed This Visit       Other   Light headed - Primary    She had a pre-syncopal episode yesterday while smoking a cigarette. May have been vaso vagal response vs dehydration vs hypoglycemia with intermittent fasting.  EKG normal sinus rhythm, no ST or T wave changes. No red flags on exam. Neuro WNL. Check CMP, CBC, TSH today. Encourage fluids and eating regularly. Follow-up based on lab results and if symptoms occur again.       Relevant Orders   CBC with Differential/Platelet   Comprehensive metabolic panel   EKG 12-Lead (Completed)   TSH    No orders of the defined types were placed in this encounter.   Return if symptoms worsen or fail to improve.  Gerre Scull, NP

## 2023-04-19 NOTE — Progress Notes (Signed)
EKG interpreted by me on 04/19/23 showed normal sinus rhythm with a heart rate of 78. No ST or T wave changes

## 2023-04-19 NOTE — Telephone Encounter (Signed)
I called and spoke with patient to ask what happened with her recent fainting episode. She said that she had quit smoking 3 days days prior. Then on Tuesday she smoked 2 cigarettes in the morning and then 2 in the evening. On Wednesday she smoked 2 cigarettes back to back and felt like she was going to pass out. She said that she told her friend that she was going to pass out  and the friend went to get a cold cloth and her hearing and vision was going but she said she remembers everything and she felt lightheaded. The friend helped her to the couch and she got her feet propped up and the air on her and the cold cloth and she rested a while then she was able to stand up and later drive home. She said after this episode she felt tired afterwards and then on Thursday she had a low heart rate but thought it was from episode on Wednesday. She said that she had some chest pains that also started on Wednesday that had a fluttering butterfly effect to it. Patient said over that last couple of days that she feels fine but wanted to be checked out. As I was talking with patient her provider Rodman Pickle, DNP could hear the conversation and recommended that patient go to the emergency room and patient did not see the need to go and wanted to come be seen in the office instead. Her appointment was moved up from tomorrow to today, 04/19/23 at 340pm.

## 2023-04-19 NOTE — Patient Instructions (Signed)
It was great to see you!  We are checking your labs today and will let you know the results via mychart/phone.   Your EKG was normal.   Make sure you are drinking plenty of water  Let's follow-up if your symptoms happen again.   Take care,  Rodman Pickle, NP

## 2023-04-19 NOTE — Assessment & Plan Note (Signed)
She had a pre-syncopal episode yesterday while smoking a cigarette. May have been vaso vagal response vs dehydration vs hypoglycemia with intermittent fasting. EKG normal sinus rhythm, no ST or T wave changes. No red flags on exam. Neuro WNL. Check CMP, CBC, TSH today. Encourage fluids and eating regularly. Follow-up based on lab results and if symptoms occur again.

## 2023-04-19 NOTE — Telephone Encounter (Signed)
I called patient back and notified her of below message and she explained further that someone called Friday at 3:23pm and LVM to return call to schedule an appointment. I scheduled patient for an appointment on Thursday May 30 at 3pm

## 2023-04-19 NOTE — Telephone Encounter (Signed)
Pt requested mychart appt and front office call and sent a text requesting her to call to be triaged for her comments listed in the message.  Comments:  Recently had a fainting episode. Follow that my heart rate has felt low or slow. Feeling mostly normal now. Somewhat of a headache. Just dull.

## 2023-04-19 NOTE — Telephone Encounter (Signed)
Noted  

## 2023-04-20 ENCOUNTER — Ambulatory Visit: Payer: Medicaid Other | Admitting: Nurse Practitioner

## 2023-04-20 LAB — CBC WITH DIFFERENTIAL/PLATELET
Basophils Absolute: 0.1 10*3/uL (ref 0.0–0.1)
Basophils Relative: 0.9 % (ref 0.0–3.0)
Eosinophils Absolute: 0.3 10*3/uL (ref 0.0–0.7)
Eosinophils Relative: 3.1 % (ref 0.0–5.0)
HCT: 42.9 % (ref 36.0–46.0)
Hemoglobin: 14.6 g/dL (ref 12.0–15.0)
Lymphocytes Relative: 34.9 % (ref 12.0–46.0)
Lymphs Abs: 3.3 10*3/uL (ref 0.7–4.0)
MCHC: 34 g/dL (ref 30.0–36.0)
MCV: 91.5 fl (ref 78.0–100.0)
Monocytes Absolute: 0.6 10*3/uL (ref 0.1–1.0)
Monocytes Relative: 6 % (ref 3.0–12.0)
Neutro Abs: 5.2 10*3/uL (ref 1.4–7.7)
Neutrophils Relative %: 55.1 % (ref 43.0–77.0)
Platelets: 345 10*3/uL (ref 150.0–400.0)
RBC: 4.69 Mil/uL (ref 3.87–5.11)
RDW: 12 % (ref 11.5–15.5)
WBC: 9.4 10*3/uL (ref 4.0–10.5)

## 2023-04-20 LAB — COMPREHENSIVE METABOLIC PANEL
ALT: 37 U/L — ABNORMAL HIGH (ref 0–35)
AST: 25 U/L (ref 0–37)
Albumin: 4.5 g/dL (ref 3.5–5.2)
Alkaline Phosphatase: 75 U/L (ref 39–117)
BUN: 7 mg/dL (ref 6–23)
CO2: 25 mEq/L (ref 19–32)
Calcium: 9.3 mg/dL (ref 8.4–10.5)
Chloride: 105 mEq/L (ref 96–112)
Creatinine, Ser: 0.79 mg/dL (ref 0.40–1.20)
GFR: 100.35 mL/min (ref >60.00)
Glucose, Bld: 78 mg/dL (ref 70–99)
Potassium: 4.1 mEq/L (ref 3.5–5.1)
Sodium: 138 mEq/L (ref 135–145)
Total Bilirubin: 1.1 mg/dL (ref 0.2–1.2)
Total Protein: 7.6 g/dL (ref 6.0–8.3)

## 2023-04-20 LAB — TSH: TSH: 2.64 u[IU]/mL (ref 0.35–5.50)

## 2023-10-02 ENCOUNTER — Ambulatory Visit: Payer: Medicaid Other | Admitting: Obstetrics and Gynecology

## 2023-10-04 ENCOUNTER — Ambulatory Visit: Payer: Medicaid Other | Admitting: Obstetrics and Gynecology

## 2023-10-04 ENCOUNTER — Encounter: Payer: Self-pay | Admitting: Obstetrics and Gynecology

## 2023-10-04 VITALS — BP 112/73 | HR 75

## 2023-10-04 DIAGNOSIS — N812 Incomplete uterovaginal prolapse: Secondary | ICD-10-CM

## 2023-10-04 DIAGNOSIS — N393 Stress incontinence (female) (male): Secondary | ICD-10-CM

## 2023-10-04 DIAGNOSIS — N3281 Overactive bladder: Secondary | ICD-10-CM | POA: Diagnosis not present

## 2023-10-04 NOTE — Progress Notes (Signed)
Urogynecology   Subjective:     Chief Complaint: Follow-up Amber Wilkins is a 32 y.o. female is here for re-evaluate for prolapsed. )  History of Present Illness: Amber Wilkins is a 32 y.o. female with stage II pelvic organ prolapse, stress incontinence and OAB.  She did use the pessary for a while but gathered some discharge and stopped using it.   Still feels vaginal bulge. She is still leaking every day. Uses panty liner but unaware of when its happening. Does not necessarily have urgency. She voids < 8 times per day and wakes up 1 time a night to urinate. Her pad is wet at night usually.   She has quit smoking. She is not planning on having any more children.   Past Medical History: Patient  has a past medical history of Asthma, Bronchitis, allergic, Female bladder prolapse, and Seasonal allergies.   Past Surgical History: She  has a past surgical history that includes Appendectomy (11/21/2000).   Medications: She has a current medication list which includes the following prescription(s): albuterol, cetirizine, and fluticasone.   Allergies: Patient is allergic to penicillins.   Social History: Patient  reports that she quit smoking about 17 months ago. Her smoking use included cigarettes and e-cigarettes. She has never used smokeless tobacco. She reports that she does not currently use drugs after having used the following drugs: Marijuana. She reports that she does not drink alcohol.      Objective:    BP 112/73   Pulse 75  Gen: No apparent distress, A&O x 3. Pelvic Exam: Normal external female genitalia; Bartholin's and Skene's glands normal in appearance; urethral meatus normal in appearance, no urethral masses or discharge.   Normal external genitalia. On speculum, normal vaginal mucosa and normal appearing cervix. On bimanual, uterus is small, mobile and nontender.   POP-Q  -1                                            Aa   -1                                            Ba  -3.5                                              C   3.5                                            Gh  5.5                                            Pb  8                                            tvl   -  3                                            Ap  -3                                            Bp  -7                                              D      Assessment/Plan:    Assessment: Amber Wilkins is a 32 y.o. with stage II pelvic organ prolapse, stress incontinence and OAB who presents for a pessary fitting.  Plan:  - She is interested in surgery for her prolapse. We discussed two options for prolapse repair:  1) vaginal repair without mesh - Pros - safer, no mesh complications - Cons - not as strong as mesh repair, higher risk of recurrence  2) laparoscopic repair with mesh - Pros - stronger, better long-term success - Cons - risks of mesh implant (erosion into vagina or bladder, adhering to the rectum, pain) - these risks are lower than with a vaginal mesh but still exist - She prefers robotic surgery with sacrocolpopexy. Handout provided for her to review.  - Due to urinary leakage without awareness, will have her undergo urodynamic testing to better qualify and offer treatment options.  - Last pap 2022- ASCUS negative HPV. She is overdue to well woman exam and repeat pap. She will contact the general GYN office to set up an appointment.   Follow up for urodynamic testing   Marguerita Beards, MD

## 2023-10-04 NOTE — Patient Instructions (Signed)

## 2023-10-25 ENCOUNTER — Encounter: Payer: Self-pay | Admitting: Obstetrics and Gynecology

## 2023-10-25 ENCOUNTER — Ambulatory Visit (INDEPENDENT_AMBULATORY_CARE_PROVIDER_SITE_OTHER): Payer: Medicaid Other | Admitting: Obstetrics and Gynecology

## 2023-10-25 VITALS — BP 131/85 | HR 80

## 2023-10-25 DIAGNOSIS — R350 Frequency of micturition: Secondary | ICD-10-CM

## 2023-10-25 DIAGNOSIS — N812 Incomplete uterovaginal prolapse: Secondary | ICD-10-CM

## 2023-10-25 DIAGNOSIS — N393 Stress incontinence (female) (male): Secondary | ICD-10-CM

## 2023-10-25 LAB — POCT URINALYSIS DIPSTICK
Bilirubin, UA: NEGATIVE
Blood, UA: NEGATIVE
Glucose, UA: NEGATIVE
Ketones, UA: NEGATIVE
Leukocytes, UA: NEGATIVE
Nitrite, UA: NEGATIVE
Protein, UA: NEGATIVE
Spec Grav, UA: 1.015 (ref 1.010–1.025)
Urobilinogen, UA: 0.2 U/dL
pH, UA: 6 (ref 5.0–8.0)

## 2023-10-25 NOTE — Addendum Note (Signed)
Addended by: Selmer Dominion on: 10/25/2023 03:17 PM   Modules accepted: Orders

## 2023-10-25 NOTE — Progress Notes (Addendum)
[facility]rogynecology Urodynamics Procedure  Referring Physician: Gerre Scull, NP Date of Procedure: 10/25/2023  Amber Wilkins is a 32 y.o. female who presents for urodynamic evaluation. Indication(s) for study: occult SUI  Vital Signs: BP 131/85   Pulse 80   Laboratory Results: A catheterized urine specimen revealed:  Lab Results  Component Value Date   COLORU yellow 10/25/2023   CLARITYU clear 10/25/2023   GLUCOSEUR Negative 10/25/2023   BILIRUBINUR negative 10/25/2023   KETONESU negative 10/25/2023   SPECGRAV 1.015 10/25/2023   RBCUR negative 10/25/2023   PHUR 6.0 10/25/2023   PROTEINUR Negative 10/25/2023   UROBILINOGEN 0.2 10/25/2023   LEUKOCYTESUR Negative 10/25/2023      Voiding Diary: Not Done  Procedure Timeout:  The correct patient was verified and the correct procedure was verified. The patient was in the correct position and safety precautions were reviewed based on at the patient's history.  Urodynamic Procedure A 70F dual lumen urodynamics catheter was placed under sterile conditions into the patient's bladder. A 70F catheter was placed into the rectum in order to measure abdominal pressure. EMG patches were placed in the appropriate position.  All connections were confirmed and calibrations/adjusted made. Saline was instilled into the bladder through the dual lumen catheters.  Cough/valsalva pressures were measured periodically during filling.  Patient was allowed to void.  The bladder was then emptied of its residual.  UROFLOW: Revealed a Qmax of 16 mL/sec.  She voided 131 mL and had a residual of 25 mL.  It was a normal pattern and represented normal habits.  CMG: This was performed with sterile water in the sitting position at a fill rate of 30 mL/min.    First sensation of fullness was 108 mLs,  First urge was 144 mLs,  Strong urge was 238 mLs and  Capacity was 468 mLs  Stress incontinence was not demonstrated Highest negative Barrier  CLPP was 200 cmH20 at 201 ml. Highest negative Barrier VLPP was 115 cmH20 at 201 ml.  Detrusor function was normal, with no phasic contractions seen.    Compliance:  Normal. End fill detrusor pressure was 0cmH20.   UPP: MUCP with barrier reduction was 129 cm of water.    MICTURITION STUDY: Patient initially attempted to void in the Urodynamics chair but was unable to void. She was moved to the bedside chair to void.  Voiding was performed with reduction using scopettes in the sitting position.  Pdet at Qmax was 13.1 cm of water.  Qmax was 43 mL/sec.  It was a intermittent pattern.  She voided 343 mL and had a residual of 125 mL.  It was a volitional void, sustained detrusor contraction was present and abdominal straining was present.   EMG: This was performed with patches.  She had voluntary contractions, recruitment with fill was present and urethral sphincter was not relaxed with void.  The details of the procedure with the study tracings have been scanned into EPIC.   Urodynamic Impression:  1. Sensation was normal; capacity was normal 2. Stress Incontinence was not demonstrated at normal pressures; 3. Detrusor Overactivity was not demonstrated. 4. Emptying was dysfunctional with a slightly elevated PVR ( ), a sustained detrusor contraction present,  abdominal straining present, dyssynergic urethral sphincter activity on EMG.  Plan: - The patient will follow up  to discuss the findings and treatment options.

## 2023-11-07 ENCOUNTER — Encounter: Payer: Self-pay | Admitting: Obstetrics and Gynecology

## 2023-11-07 ENCOUNTER — Ambulatory Visit: Payer: Medicaid Other | Admitting: Obstetrics and Gynecology

## 2023-11-07 VITALS — BP 126/84 | HR 80

## 2023-11-07 DIAGNOSIS — N812 Incomplete uterovaginal prolapse: Secondary | ICD-10-CM

## 2023-11-07 DIAGNOSIS — N3281 Overactive bladder: Secondary | ICD-10-CM | POA: Diagnosis not present

## 2023-11-07 MED ORDER — SOLIFENACIN SUCCINATE 5 MG PO TABS: 5.0000 mg | ORAL_TABLET | Freq: Every day | ORAL | 5 refills | Status: DC

## 2023-11-07 NOTE — Patient Instructions (Signed)
Plan for surgery: Robotic total laparoscopic hysterectomy with removal of fallopian tubes, sacrocolpopexy (placement of mesh), cystoscopy

## 2023-11-07 NOTE — Progress Notes (Signed)
[facility]   Subjective:     Chief Complaint: Follow-up  History of Present Illness: Amber Wilkins is a 32 y.o. female with stage II pelvic organ prolapse, stress incontinence and OAB.   She feels the prolapse has been feeling more bothersome. Feels more of a bulge and it is causing some difficulty with emptying her bladder. Has tried the pessary for prolapse and leakage and did not work well for her.   Urodynamic Impression:  1. Sensation was normal; capacity was normal 2. Stress Incontinence was not demonstrated at normal pressures; 3. Detrusor Overactivity was not demonstrated. 4. Emptying was dysfunctional with a slightly elevated PVR ( ), a sustained detrusor contraction present,  abdominal straining present, dyssynergic urethral sphincter activity on EMG.  Past Medical History: Patient  has a past medical history of Asthma, Bronchitis, allergic, Female bladder prolapse, and Seasonal allergies.   Past Surgical History: She  has a past surgical history that includes Appendectomy (11/21/2000).   Medications: She has a current medication list which includes the following prescription(s): albuterol, cetirizine, fluticasone, and solifenacin.   Allergies: Patient is allergic to penicillins.   Social History: Patient  reports that she quit smoking about 18 months ago. Her smoking use included cigarettes and e-cigarettes. She has never used smokeless tobacco. She reports that she does not currently use drugs after having used the following drugs: Marijuana. She reports that she does not drink alcohol.      Objective:    BP 126/84   Pulse 80  Gen: No apparent distress, A&O x 3.  Prior exam: POP-Q   -1                                            Aa   -1                                           Ba   -3.5                                              C    3.5                                            Gh   5.5                                             Pb   8                                            tvl    -3  Ap   -3                                            Bp   -7                                              D       Assessment/Plan:    Assessment: [name] is a 32 y.o. with stage II pelvic organ prolapse, stress incontinence and OAB who presents for a pessary fitting.  Plan:  - Last pap 2022- ASCUS negative HPV. Has appt for repeat pap next week.  - For incontinence, she did not demonstrate SUI on urodynamics so we discussed that the leakage is likely due to overactive bladder or possibly incomplete emptying (although PVR was on the high end of normal).  She is interested in starting medication for OAB- prescribed vesicare 5mg  daily. Advised to watch for symptoms of dry mouth, dry eyes, or constipation. Will follow up on improvement with medication at pre op visit.    - Plan for surgery: Exam under anesthesia, robotic assisted total laparoscopic hysterectomy with bilateral salingectomy, sacrocolpopexy, cystoscopy  - We reviewed the patient's specific anatomic and functional findings, with the assistance of diagrams, and together finalized the above procedure. The planned surgical procedures were discussed along with the surgical risks outlined below, which were also provided on a detailed handout. Additional treatment options including expectant management, conservative management, medical management were discussed where appropriate.  We reviewed the benefits and risks of each treatment option.   General Surgical Risks: For all procedures, there are risks of bleeding, infection, damage to surrounding organs including but not limited to bowel, bladder, blood vessels, ureters and nerves, and need for further surgery if an injury were to occur. These risks are all low with minimally invasive surgery.   There are risks of numbness and weakness at any body site or buttock/rectal pain.  It  is possible that baseline pain can be worsened by surgery, either with or without mesh. If surgery is vaginal, there is also a low risk of possible conversion to laparoscopy or open abdominal incision where indicated. Very rare risks include blood transfusion, blood clot, heart attack, pneumonia, or death.   There is also a risk of short-term postoperative urinary retention with need to use a catheter. About half of patients need to go home from surgery with a catheter, which is then later removed in the office. The risk of long-term need for a catheter is very low. There is also a risk of worsening of overactive bladder.   Prolapse (with or without mesh): Risk factors for surgical failure  include things that put pressure on your pelvis and the surgical repair, including obesity, chronic cough, and heavy lifting or straining (including lifting children or adults, straining on the toilet, or lifting heavy objects such as furniture or anything weighing >25 lbs. Risks of recurrence is 20-30% with vaginal native tissue repair and a less than 10% with sacrocolpopexy with mesh.    Sacrocolpopexy: Mesh implants may provide more prolapse support, but do have some unique risks to consider. It is important to understand that mesh is permanent and cannot be easily removed. Risks of abdominal sacrocolpopexy mesh include mesh exposure (~  3-6%), painful intercourse (recent studies show lower rates after surgery compared to before, with ~5-8% risk of new onset), and very rare risks of bowel or bladder injury or infection (<1%). The risk of mesh exposure is more likely in a woman with risks for poor healing (prior radiation, poorly controlled diabetes, or immunocompromised). The risk of new or worsened chronic pain after mesh implant is more common in women with baseline chronic pain and/or poorly controlled anxiety or depression. There is an FDA safety notification on vaginal mesh procedures for prolapse but NOT abdominal  mesh procedures and therefore does not apply to your surgery. We have extensive experience and training with mesh placement and we have close postoperative follow up to identify any potential complications from mesh.    - For preop Visit:  She is required to have a visit within 30 days of her surgery.    - Medical clearance: not required  - Anticoagulant use: No - Medicaid Hysterectomy form: Yes- will need at pre op - Accepts blood transfusion: Yes - Expected length of stay: outpatient  Request sent for surgery scheduling.   Marguerita Beards, MD

## 2023-11-21 ENCOUNTER — Encounter: Payer: Self-pay | Admitting: Obstetrics and Gynecology

## 2023-11-21 ENCOUNTER — Other Ambulatory Visit: Payer: Self-pay

## 2023-11-21 ENCOUNTER — Other Ambulatory Visit (HOSPITAL_COMMUNITY)
Admission: RE | Admit: 2023-11-21 | Discharge: 2023-11-21 | Disposition: A | Discharge status: Home / Self Care | Payer: Medicaid Other | Source: Ambulatory Visit | Attending: Obstetrics and Gynecology | Admitting: Obstetrics and Gynecology

## 2023-11-21 ENCOUNTER — Ambulatory Visit: Payer: Medicaid Other | Admitting: Obstetrics and Gynecology

## 2023-11-21 VITALS — BP 138/84 | HR 90 | Wt 213.6 lb

## 2023-11-21 DIAGNOSIS — Z8742 Personal history of other diseases of the female genital tract: Secondary | ICD-10-CM

## 2023-11-21 DIAGNOSIS — R030 Elevated blood-pressure reading, without diagnosis of hypertension: Secondary | ICD-10-CM

## 2023-11-21 DIAGNOSIS — Z01818 Encounter for other preprocedural examination: Secondary | ICD-10-CM

## 2023-11-21 NOTE — Progress Notes (Signed)
 Obstetrics and Gynecology Visit Return Patient Evaluation  Appointment Date: 11/21/2023  Primary Care Provider: Nedra Tinnie LABOR  OBGYN Clinic: [facility]  Chief Complaint: follow up pap  History of Present Illness:  Amber Wilkins is a 32 y.o. here for follow up pap. Patient with 11/2020 ascus/hpv neg pap with plan for repeat in one year. Patient also getting set up by urogyn for RATLH/BS/SCP/cysto.   Review of Systems: as noted in the History of Present Illness.   Patient Active Problem List   Diagnosis Date Noted   Moderate persistent asthma without complication 06/29/2022   Female bladder prolapse 06/29/2022   Obesity, Class I, BMI 30.0-34.9 (see actual BMI) 06/07/2016   Medications:  Tinnie LOISE. Quirarte had no medications administered during this visit. Current Outpatient Medications  Medication Sig Dispense Refill   albuterol  (VENTOLIN  HFA) 108 (90 Base) MCG/ACT inhaler Inhale 1-2 puffs into the lungs every 6 (six) hours as needed for wheezing or shortness of breath. 18 g 1   cetirizine  (ZYRTEC ) 10 MG tablet Take 1 tablet (10 mg total) by mouth daily. 90 tablet 3   fluticasone  (FLOVENT  HFA) 110 MCG/ACT inhaler Inhale 2 puffs into the lungs in the morning and at bedtime. 1 each 12   ibuprofen  (ADVIL ) 800 MG tablet Take 800 mg by mouth every 6 (six) hours as needed.     clindamycin (CLEOCIN) 300 MG capsule Take 300 mg by mouth every 6 (six) hours. (Patient not taking: Reported on 11/21/2023)     solifenacin  (VESICARE ) 5 MG tablet Take 1 tablet (5 mg total) by mouth daily. (Patient not taking: Reported on 11/21/2023) 30 tablet 5   No current facility-administered medications for this visit.    Allergies: is allergic to penicillins.  Physical Exam:  BP 138/84   Pulse 90   Wt 213 lb 9.6 oz (96.9 kg)   LMP 11/07/2023 (Approximate)   BMI 39.07 kg/m  Body mass index is 39.07 kg/m. General appearance: Well nourished, well developed female  in no acute distress.  Neuro/Psych:  Normal mood and affect.    Pelvic exam:  Cervical exam performed in the presence of a chaperone EGBUS: wnl Vaginal vault: wnl Cervix:  wnl   Assessment: patient stable  Plan:  1. History of abnormal cervical Pap smear ([facility])  - Cytology - PAP( [place]) - Cervicovaginal ancillary only( [place])  2. Transient hypertension Repeat BP wnl  3. Preop testing - Cytology - PAP( [place]) - Cervicovaginal ancillary only( [place])   RTC: PRN   Bebe Izell Raddle MD Attending Center for [facility])

## 2023-11-23 LAB — CERVICOVAGINAL ANCILLARY ONLY
Bacterial Vaginitis (gardnerella): NEGATIVE
Candida Glabrata: NEGATIVE
Candida Vaginitis: POSITIVE — AB
Chlamydia: NEGATIVE
Comment: NEGATIVE
Comment: NEGATIVE
Comment: NEGATIVE
Comment: NEGATIVE
Comment: NEGATIVE
Comment: NORMAL
Neisseria Gonorrhea: NEGATIVE
Trichomonas: NEGATIVE

## 2023-11-24 MED ORDER — FLUCONAZOLE 150 MG PO TABS
150.0000 mg | ORAL_TABLET | Freq: Once | ORAL | 0 refills | Status: AC
Start: 2023-11-24 — End: 2023-11-24

## 2023-11-24 NOTE — Addendum Note (Signed)
 Addended by: Ivanhoe Bing on: 11/24/2023 10:38 AM   Modules accepted: Orders

## 2023-11-29 LAB — CYTOLOGY - PAP
Comment: NEGATIVE
Diagnosis: NEGATIVE
High risk HPV: NEGATIVE

## 2023-12-08 ENCOUNTER — Encounter: Payer: Self-pay | Admitting: Obstetrics and Gynecology

## 2023-12-27 ENCOUNTER — Encounter (HOSPITAL_BASED_OUTPATIENT_CLINIC_OR_DEPARTMENT_OTHER): Payer: Self-pay | Admitting: Obstetrics and Gynecology

## 2023-12-27 NOTE — Progress Notes (Signed)
 Your procedure is scheduled on :  Tuesday,  01-02-2024  Report to [facility]  AT  _9:30__ AM.   Call this number if you have problems the morning of surgery:  [phone] Any questions prior to surgery call pre-op nurse,  Oneil Behney:  [phone]   OUR ADDRESS IS [address].  WE ARE LOCATED IN [facility]  PLEASE BRING YOUR INSURANCE CARD AND PHOTO ID DAY OF SURGERY.                                     REMEMBER:  Do not eat food after midnight night before surgery.  You may have clear liquid diet from midnight night before surgery until 8:30 AM.   NO clear liquids after  8:30 AM  day of surgery.  This includes no water ,  candy,  gum,  and  mints.   Please brush your teeth morning of surgery and rinse mouth out.   CLEAR LIQUID DIET Allowed      Water                                                                    Coffee and tea, regular and decaf  (NO cream or milk products of any type, may sweeten,  no honey)                         Carbonated beverages, regular and diet                                    Sports drinks like Gatorade _____________________________________________________________________     TAKE ONLY THESE MEDICATIONS MORNING OF SURGERY: May have sips of water  Flovent  inhaler Please bring flovent  inhaler and albuterol  inhaler with you day of surgery                                       DO NOT WEAR JEWERLY/  METAL/  PIERCINGS (INCLUDING NO PLASTIC PIERCINGS) DO NOT WEAR LOTIONS, POWDERS, PERFUMES OR NAIL POLISH ON YOUR FINGERNAILS. TOENAIL POLISH IS OK TO WEAR. DO NOT SHAVE FOR 48 HOURS PRIOR TO DAY OF SURGERY.  CONTACTS, GLASSES, OR DENTURES MAY NOT BE WORN TO SURGERY.  REMEMBER: NO SMOKING, VAPING ,  DRUGS OR ALCOHOL FOR 24 HOURS BEFORE YOUR SURGERY.                                    [place] IS NOT RESPONSIBLE  FOR ANY BELONGINGS.                                                                     [facility]  [place] - Preparing for Surgery Before surgery, you can play an important role.  Because skin is not sterile, your skin needs to be as free of germs as possible.  You can reduce the number of germs on your skin by washing with CHG (chlorahexidine gluconate) soap before surgery.  CHG is an antiseptic cleaner which kills germs and bonds with the skin to continue killing germs even after washing. Please DO NOT use if you have an allergy to CHG or antibacterial soaps.  If your skin becomes reddened/irritated stop using the CHG and inform your nurse when you arrive at Short Stay. Do not shave (including legs and underarms) for at least 48 hours prior to the first CHG shower.  You may shave your face/neck. Please follow these instructions carefully:  1.  Shower with CHG Soap the night before surgery and the  morning of Surgery.  2.  If you choose to wash your hair, wash your hair first as usual with your  normal  shampoo.  3.  After you shampoo, rinse your hair and body thoroughly to remove the  shampoo.                                        4.  Use CHG as you would any other liquid soap.  You can apply chg directly  to the skin and wash , chg soap provided, night before and morning of your surgery.  5.  Apply the CHG Soap to your body ONLY FROM THE NECK DOWN.   Do not use on face/ open                           Wound or open sores. Avoid contact with eyes, ears mouth and genitals (private parts).                       Wash face,  Genitals (private parts) with your normal soap.             6.  Wash thoroughly, paying special attention to the area where your surgery  will be performed.  7.  Thoroughly rinse your body with warm water  from the neck down.  8.  DO NOT shower/wash with your normal soap after using and rinsing off  the CHG Soap.             9.  Pat yourself dry with a clean towel.            10.  Wear clean pajamas.            11.  Place clean sheets on your bed the night of  your first shower and do not  sleep with pets. Day of Surgery : Do not apply any lotions/ powders the morning of surgery.  Please wear clean clothes to the hospital/surgery center.  IF YOU HAVE ANY SKIN IRRITATION OR PROBLEMS WITH THE SURGICAL SOAP, PLEASE GET A BAR OF GOLD DIAL SOAP AND SHOWER THE NIGHT BEFORE YOUR SURGERY AND THE MORNING OF YOUR SURGERY. PLEASE LET THE NURSE KNOW MORNING OF YOUR SURGERY IF YOU HAD ANY PROBLEMS WITH THE SURGICAL SOAP.   YOUR SURGEON MAY HAVE REQUESTED EXTENDED RECOVERY TIME AFTER YOUR SURGERY. IT COULD BE A  JUST A FEW HOURS  UP TO AN OVERNIGHT STAY.  YOUR SURGEON SHOULD HAVE DISCUSSED  THIS WITH YOU PRIOR TO YOUR SURGERY. IN THE EVENT YOU NEED TO STAY OVERNIGHT PLEASE REFER TO THE FOLLOWING GUIDELINES. YOU MAY HAVE UP TO 4 VISITORS  MAY VISIT IN THE EXTENDED RECOVERY ROOM UNTIL 800 PM ONLY.  ONE  VISITOR AGE 12 AND OVER MAY SPEND THE NIGHT AND MUST BE IN EXTENDED RECOVERY ROOM NO LATER THAN 800 PM . YOUR DISCHARGE TIME AFTER YOU SPEND THE NIGHT IS 900 AM THE MORNING AFTER YOUR SURGERY. YOU MAY PACK A SMALL OVERNIGHT BAG WITH TOILETRIES FOR YOUR OVERNIGHT STAY IF YOU WISH.  REGARDLESS OF IF YOU STAY OVER NIGHT OR ARE DISCHARGED THE SAME DAY YOU WILL BE REQUIRED TO HAVE A RESPONSIBLE ADULT (18 YRS OLD OR OLDER) STAY WITH YOU FOR AT LEAST THE FIRST 66 HOURS WHEN HOME.  YOUR PRESCRIPTION MEDICATIONS WILL BE PROVIDED DURING [facility] STAY.  ________________________________________________________________________

## 2023-12-27 NOTE — Progress Notes (Signed)
 Spoke w/ via phone for pre-op interview--- pt Lab needs dos----   urine preg      Lab results------ lab appt 12-29-2023 @ 1300 getting CBC/ T&S COVID test -----patient states asymptomatic no test needed Arrive at -------  0930 on 01-02-2024 NPO after MN NO Solid Food.  Clear liquids from MN until--- 0830 Med rec completed Medications to take morning of surgery ----- flovent  inhaler Diabetic medication ----- n/a Patient instructed no nail polish to be worn day of surgery Patient instructed to bring photo id and insurance card day of surgery Patient aware to have Driver (ride ) / caregiver    for 24 hours after surgery - mother, wendy Patient Special Instructions ----- will pick up bag w/ hibiclens and written instructions at lab appt. Asked to call w/ any questions Pre-Op special Instructions ----- case just added on today.  Pre-op orders pending. Patient verbalized understanding of instructions that were given at this phone interview. Patient denies chest pain, sob, fever, cough at the interview.

## 2023-12-29 ENCOUNTER — Encounter: Payer: Self-pay | Admitting: Obstetrics and Gynecology

## 2023-12-29 ENCOUNTER — Ambulatory Visit: Payer: Medicaid Other | Admitting: Obstetrics and Gynecology

## 2023-12-29 ENCOUNTER — Encounter (HOSPITAL_COMMUNITY)
Admission: RE | Admit: 2023-12-29 | Discharge: 2023-12-29 | Disposition: A | Discharge status: Home / Self Care | Payer: Medicaid Other | Source: Ambulatory Visit | Attending: Obstetrics and Gynecology

## 2023-12-29 VITALS — BP 133/84 | HR 75

## 2023-12-29 DIAGNOSIS — Z01812 Encounter for preprocedural laboratory examination: Secondary | ICD-10-CM | POA: Insufficient documentation

## 2023-12-29 DIAGNOSIS — N3281 Overactive bladder: Secondary | ICD-10-CM

## 2023-12-29 DIAGNOSIS — Z01818 Encounter for other preprocedural examination: Secondary | ICD-10-CM

## 2023-12-29 DIAGNOSIS — N812 Incomplete uterovaginal prolapse: Secondary | ICD-10-CM | POA: Diagnosis not present

## 2023-12-29 LAB — CBC
HCT: 45.9 % (ref 36.0–46.0)
Hemoglobin: 15.4 g/dL — ABNORMAL HIGH (ref 12.0–15.0)
MCH: 30.7 pg (ref 26.0–34.0)
MCHC: 33.6 g/dL (ref 30.0–36.0)
MCV: 91.6 fL (ref 80.0–100.0)
Platelets: 382 10*3/uL (ref 150–400)
RBC: 5.01 MIL/uL (ref 3.87–5.11)
RDW: 11.7 % (ref 11.5–15.5)
WBC: 13 10*3/uL — ABNORMAL HIGH (ref 4.0–10.5)
nRBC: 0 % (ref 0.0–0.2)

## 2023-12-29 MED ORDER — POLYETHYLENE GLYCOL 3350 17 GM/SCOOP PO POWD: 17.0000 g | Freq: Every day | ORAL | 0 refills | Status: DC

## 2023-12-29 MED ORDER — ACETAMINOPHEN 500 MG PO TABS: 500.0000 mg | ORAL_TABLET | Freq: Four times a day (QID) | ORAL | 0 refills | Status: DC | PRN

## 2023-12-29 MED ORDER — IBUPROFEN 600 MG PO TABS: 600.0000 mg | ORAL_TABLET | Freq: Four times a day (QID) | ORAL | 0 refills | Status: DC | PRN

## 2023-12-29 MED ORDER — OXYCODONE HCL 5 MG PO TABS: 5.0000 mg | ORAL_TABLET | ORAL | 0 refills | Status: DC | PRN

## 2023-12-29 NOTE — H&P (Signed)
 [facility] Pre Op H&P  Subjective Chief Complaint: Amber Wilkins presents for follow up for prolapse and incontinence  History of Present Illness: Amber Wilkins is a 33 y.o. female who presents for preoperative visit.  She is scheduled to undergo Exam under anesthesia, robotic assisted total laparoscopic hysterectomy with bilateral salingectomy, sacrocolpopexy, cystoscopy on 01/02/24.  Her symptoms include vaginal bulge and incontinence, and she was was found to have Stage II anterior, Stage 0 posterior, Stage I apical prolapse.   She was prescribed vesicare  at last visit but has not taken it. Would prefer to wait to reevaluate symptoms until after surgery.   Urodynamics showed: 1. Sensation was normal; capacity was normal 2. Stress Incontinence was not demonstrated at normal pressures; 3. Detrusor Overactivity was not demonstrated. 4. Emptying was dysfunctional with a slightly elevated PVR ( ), a sustained detrusor contraction present,  abdominal straining present, dyssynergic urethral sphincter activity on EMG.  Past Medical History:  Diagnosis Date   History of abnormal cervical Pap smear    Moderate persistent asthma without complication    followed by pcp   OAB (overactive bladder)    Seasonal allergies    SUI (stress urinary incontinence, female)    Uterovaginal prolapse, incomplete    Wears glasses      Past Surgical History:  Procedure Laterality Date   APPENDECTOMY  09/25/2001   @[facility]  by dr m. claudius;  open   WISDOM TOOTH EXTRACTION      is allergic to penicillins and latex.   Family History  Problem Relation Age of Onset   Seizures Mother    Ovarian cancer Mother    Cervical cancer Mother    Lupus Mother    Kidney disease Mother    Hypertension Father    Alcohol abuse Father    Hypertension Paternal Grandmother    Stroke Paternal Grandfather    Hypertension Paternal Grandfather    Heart disease Maternal Grandmother     Social  History   Tobacco Use   Smoking status: Former    Types: Cigarettes   Smokeless tobacco: Never   Tobacco comments:    12-27-2023  quit smoking 01/ 2024 ,  started smoking age 80 (smoked 16 yrs)  Vaping Use   Vaping status: Former   Devices: quit 2023  Substance Use Topics   Alcohol use: No   Drug use: Not Currently    Types: Marijuana    Comment: 12-27-2023  pt stated last smoked past month     Review of Systems was negative for a full 10 system review except as noted in the History of Present Illness.  No current facility-administered medications for this encounter.  Current Outpatient Medications:    albuterol  (VENTOLIN  HFA) 108 (90 Base) MCG/ACT inhaler, Inhale 1-2 puffs into the lungs every 6 (six) hours as needed for wheezing or shortness of breath. (Patient taking differently: Inhale 1-2 puffs into the lungs every 6 (six) hours as needed for wheezing or shortness of breath.), Disp: 18 g, Rfl: 1   cetirizine  (ZYRTEC ) 10 MG tablet, Take 1 tablet (10 mg total) by mouth daily. (Patient taking differently: Take 10 mg by mouth at bedtime.), Disp: 90 tablet, Rfl: 3   fluticasone  (FLOVENT  HFA) 110 MCG/ACT inhaler, Inhale 2 puffs into the lungs in the morning and at bedtime. (Patient taking differently: Inhale 2 puffs into the lungs in the morning and at bedtime.), Disp: 1 each, Rfl: 12   ibuprofen  (ADVIL ) 800 MG tablet, Take 800 mg by mouth  every 6 (six) hours as needed., Disp: , Rfl:    Naphazoline HCl (CLEAR EYES OP), Apply to eye as needed., Disp: , Rfl:    acetaminophen  (TYLENOL ) 500 MG tablet, Take 1 tablet (500 mg total) by mouth every 6 (six) hours as needed (pain)., Disp: 30 tablet, Rfl: 0   ibuprofen  (ADVIL ) 600 MG tablet, Take 1 tablet (600 mg total) by mouth every 6 (six) hours as needed., Disp: 30 tablet, Rfl: 0   oxyCODONE  (OXY IR/ROXICODONE ) 5 MG immediate release tablet, Take 1 tablet (5 mg total) by mouth every 4 (four) hours as needed for severe pain (pain score 7-10).,  Disp: 15 tablet, Rfl: 0   polyethylene glycol powder (GLYCOLAX /MIRALAX ) 17 GM/SCOOP powder, Take 17 g by mouth daily. Drink 17g (1 scoop) dissolved in water  per day., Disp: 255 g, Rfl: 0   solifenacin  (VESICARE ) 5 MG tablet, Take 1 tablet (5 mg total) by mouth daily. (Patient not taking: Reported on 11/21/2023), Disp: 30 tablet, Rfl: 5   Objective There were no vitals filed for this visit.   Gen: NAD CV: S1 S2 RRR Lungs: Clear to auscultation bilaterally Abd: soft, nontender   Previous Pelvic Exam showed: POP-Q   -1                                            Aa   -1                                           Ba   -3.5                                              C    3.5                                            Gh   5.5                                            Pb   8                                            tvl    -3                                            Ap   -3                                            Bp   -7  D       Assessment/ Plan  The patient is a 33 y.o. year old with stage II POP scheduled to undergo Exam under anesthesia, robotic assisted total laparoscopic hysterectomy with bilateral salingectomy, sacrocolpopexy, cystoscopy.     Amber LOISE Caper, MD

## 2023-12-29 NOTE — Progress Notes (Signed)
 [facility] Return visit  Subjective Chief Complaint: Amber Wilkins presents for follow up for prolapse and incontinence  History of Present Illness: Amber Wilkins is a 33 y.o. female who presents for preoperative visit.  She is scheduled to undergo Exam under anesthesia, robotic assisted total laparoscopic hysterectomy with bilateral salingectomy, sacrocolpopexy, cystoscopy on 01/02/24.  Her symptoms include vaginal bulge and incontinence, and she was was found to have Stage II anterior, Stage 0 posterior, Stage I apical prolapse.   She was prescribed vesicare  at last visit but has not taken it. Would prefer to wait to reevaluate symptoms until after surgery.   Urodynamics showed: 1. Sensation was normal; capacity was normal 2. Stress Incontinence was not demonstrated at normal pressures; 3. Detrusor Overactivity was not demonstrated. 4. Emptying was dysfunctional with a slightly elevated PVR ( ), a sustained detrusor contraction present,  abdominal straining present, dyssynergic urethral sphincter activity on EMG.  Past Medical History:  Diagnosis Date   History of abnormal cervical Pap smear    Moderate persistent asthma without complication    followed by pcp   OAB (overactive bladder)    Seasonal allergies    SUI (stress urinary incontinence, female)    Uterovaginal prolapse, incomplete    Wears glasses      Past Surgical History:  Procedure Laterality Date   APPENDECTOMY  09/25/2001   @[facility]  by dr m. claudius;  open   WISDOM TOOTH EXTRACTION      is allergic to penicillins and latex.   Family History  Problem Relation Age of Onset   Seizures Mother    Ovarian cancer Mother    Cervical cancer Mother    Lupus Mother    Kidney disease Mother    Hypertension Father    Alcohol abuse Father    Hypertension Paternal Grandmother    Stroke Paternal Grandfather    Hypertension Paternal Grandfather    Heart disease Maternal Grandmother     Social  History   Tobacco Use   Smoking status: Former    Types: Cigarettes   Smokeless tobacco: Never   Tobacco comments:    12-27-2023  quit smoking 01/ 2024 ,  started smoking age 73 (smoked 16 yrs)  Vaping Use   Vaping status: Former   Devices: quit 2023  Substance Use Topics   Alcohol use: No   Drug use: Not Currently    Types: Marijuana    Comment: 12-27-2023  pt stated last smoked past month     Review of Systems was negative for a full 10 system review except as noted in the History of Present Illness.   Current Outpatient Medications:    albuterol  (VENTOLIN  HFA) 108 (90 Base) MCG/ACT inhaler, Inhale 1-2 puffs into the lungs every 6 (six) hours as needed for wheezing or shortness of breath. (Patient taking differently: Inhale 1-2 puffs into the lungs every 6 (six) hours as needed for wheezing or shortness of breath.), Disp: 18 g, Rfl: 1   cetirizine  (ZYRTEC ) 10 MG tablet, Take 1 tablet (10 mg total) by mouth daily. (Patient taking differently: Take 10 mg by mouth at bedtime.), Disp: 90 tablet, Rfl: 3   fluticasone  (FLOVENT  HFA) 110 MCG/ACT inhaler, Inhale 2 puffs into the lungs in the morning and at bedtime. (Patient taking differently: Inhale 2 puffs into the lungs in the morning and at bedtime.), Disp: 1 each, Rfl: 12   ibuprofen  (ADVIL ) 800 MG tablet, Take 800 mg by mouth every 6 (six) hours as needed., Disp: ,  Rfl:    Naphazoline HCl (CLEAR EYES OP), Apply to eye as needed., Disp: , Rfl:    solifenacin  (VESICARE ) 5 MG tablet, Take 1 tablet (5 mg total) by mouth daily. (Patient not taking: Reported on 11/21/2023), Disp: 30 tablet, Rfl: 5   Objective Vitals:   12/29/23 1414  BP: 133/84  Pulse: 75    Gen: NAD CV: S1 S2 RRR Lungs: Clear to auscultation bilaterally Abd: soft, nontender   Previous Pelvic Exam showed: POP-Q   -1                                            Aa   -1                                           Ba   -3.5                                               C    3.5                                            Gh   5.5                                            Pb   8                                            tvl    -3                                            Ap   -3                                            Bp   -7                                              D       Assessment/ Plan  Assessment: The patient is a 33 y.o. year old scheduled to undergo Exam under anesthesia, robotic assisted total laparoscopic hysterectomy with bilateral salingectomy, sacrocolpopexy, cystoscopy.   Plan: General Surgical Consent: The patient has previously been counseled on alternative treatments, and the decision by the patient and provider was to proceed with the procedure listed above.  For all procedures, there are risks of bleeding, infection, damage to surrounding organs including but not limited to bowel, bladder, blood vessels,  ureters and nerves, and need for further surgery if an injury were to occur. These risks are all low with minimally invasive surgery.   There are risks of numbness and weakness at any body site or buttock/rectal pain.  It is possible that baseline pain can be worsened by surgery, either with or without mesh. If surgery is vaginal, there is also a low risk of possible conversion to laparoscopy or open abdominal incision where indicated. Very rare risks include blood transfusion, blood clot, heart attack, pneumonia, or death.   There is also a risk of short-term postoperative urinary retention with need to use a catheter. About half of patients need to go home from surgery with a catheter, which is then later removed in the office. The risk of long-term need for a catheter is very low. There is also a risk of worsening of overactive bladder.    Prolapse (with or without mesh): Risk factors for surgical failure  include things that put pressure on your pelvis and the surgical repair, including obesity, chronic cough,  and heavy lifting or straining (including lifting children or adults, straining on the toilet, or lifting heavy objects such as furniture or anything weighing >25 lbs. Risks of recurrence is 20-30% with vaginal native tissue repair and a less than 10% with sacrocolpopexy with mesh.    Sacrocolpopexy: Mesh implants may provide more prolapse support, but do have some unique risks to consider. It is important to understand that mesh is permanent and cannot be easily removed. Risks of abdominal sacrocolpopexy mesh include mesh exposure (~3-6%), painful intercourse (recent studies show lower rates after surgery compared to before, with ~5-8% risk of new onset), and very rare risks of bowel or bladder injury or infection (<1%). The risk of mesh exposure is more likely in a woman with risks for poor healing (prior radiation, poorly controlled diabetes, or immunocompromised). The risk of new or worsened chronic pain after mesh implant is more common in women with baseline chronic pain and/or poorly controlled anxiety or depression. There is an FDA safety notification on vaginal mesh procedures for prolapse but NOT abdominal mesh procedures and therefore does not apply to your surgery. We have extensive experience and training with mesh placement and we have close postoperative follow up to identify any potential complications from mesh.    We discussed consent for blood products. Risks for blood transfusion include allergic reactions, other reactions that can affect different body organs and managed accordingly, transmission of infectious diseases such as HIV or Hepatitis. However, the blood is screened. Patient consents for blood products.  Pre-operative instructions:  She was instructed to not take Aspirin/NSAIDs x 7days prior to surgery.  Antibiotic prophylaxis was ordered as indicated.  Catheter use: Patient will go home with foley if needed after post-operative voiding trial.  Post-operative instructions:   She was provided with specific post-operative instructions, including precautions and signs/symptoms for which we would recommend contacting us , in addition to daytime and after-hours contact phone numbers. This was provided on a handout.   Post-operative medications: Prescriptions for motrin , tylenol , miralax , and oxycodone  were sent to her pharmacy. Discussed using ibuprofen  and tylenol  on a schedule to limit use of narcotics.   Laboratory testing:  CBC and type and screen obtained. Day of surgery UPT  Preoperative clearance:  She does not require surgical clearance.    Post-operative follow-up:  A post-operative appointment will be made for 6 weeks from the date of surgery. If she needs a post-operative nurse visit for a  voiding trial, that will be set up after she leaves the hospital.    Patient will call the clinic or use MyChart should anything change or any new issues arise.   Amber LOISE Caper, MD

## 2024-01-01 NOTE — Anesthesia Preprocedure Evaluation (Addendum)
Anesthesia Evaluation  Patient identified by MRN, date of birth, ID band Patient awake    Reviewed: Allergy & Precautions, NPO status , Patient's Chart, lab work & pertinent test results  Airway Mallampati: II  TM Distance: >3 FB Neck ROM: Full    Dental no notable dental hx. (+) Teeth Intact, Dental Advisory Given   Pulmonary asthma , former smoker   Pulmonary exam normal breath sounds clear to auscultation       Cardiovascular negative cardio ROS Normal cardiovascular exam Rhythm:Regular Rate:Normal     Neuro/Psych negative neurological ROS  negative psych ROS   GI/Hepatic negative GI ROS, Neg liver ROS,,,  Endo/Other  negative endocrine ROS    Renal/GU negative Renal ROS     Musculoskeletal negative musculoskeletal ROS (+)    Abdominal  (+) + obese  Peds  Hematology negative hematology ROS (+)   Anesthesia Other Findings All: PCN, latex  Reproductive/Obstetrics                             Anesthesia Physical Anesthesia Plan  ASA: 3  Anesthesia Plan: General   Post-op Pain Management: Regional block*, Minimal or no pain anticipated and Ketamine IV*   Induction: Intravenous  PONV Risk Score and Plan: 4 or greater and Treatment may vary due to age or medical condition, Midazolam, Ondansetron and Dexamethasone  Airway Management Planned: Oral ETT  Additional Equipment: None  Intra-op Plan:   Post-operative Plan: Extubation in OR  Informed Consent: I have reviewed the patients History and Physical, chart, labs and discussed the procedure including the risks, benefits and alternatives for the proposed anesthesia with the patient or authorized representative who has indicated his/her understanding and acceptance.     Dental advisory given  Plan Discussed with: CRNA and Surgeon  Anesthesia Plan Comments:        Anesthesia Quick Evaluation

## 2024-01-02 ENCOUNTER — Ambulatory Visit (HOSPITAL_BASED_OUTPATIENT_CLINIC_OR_DEPARTMENT_OTHER)
Admission: RE | Admit: 2024-01-02 | Discharge: 2024-01-02 | Disposition: A | Discharge status: Home / Self Care | Payer: Medicaid Other | Attending: Obstetrics and Gynecology | Admitting: Obstetrics and Gynecology

## 2024-01-02 ENCOUNTER — Encounter (HOSPITAL_BASED_OUTPATIENT_CLINIC_OR_DEPARTMENT_OTHER): Payer: Self-pay | Admitting: Obstetrics and Gynecology

## 2024-01-02 ENCOUNTER — Encounter (HOSPITAL_BASED_OUTPATIENT_CLINIC_OR_DEPARTMENT_OTHER)
Admission: RE | Disposition: A | Discharge status: Home / Self Care | Payer: Self-pay | Source: Home / Self Care | Attending: Obstetrics and Gynecology

## 2024-01-02 ENCOUNTER — Other Ambulatory Visit: Payer: Self-pay

## 2024-01-02 ENCOUNTER — Ambulatory Visit (HOSPITAL_BASED_OUTPATIENT_CLINIC_OR_DEPARTMENT_OTHER): Payer: Medicaid Other | Admitting: Anesthesiology

## 2024-01-02 ENCOUNTER — Ambulatory Visit (HOSPITAL_BASED_OUTPATIENT_CLINIC_OR_DEPARTMENT_OTHER): Payer: Self-pay | Admitting: Anesthesiology

## 2024-01-02 DIAGNOSIS — J4540 Moderate persistent asthma, uncomplicated: Secondary | ICD-10-CM | POA: Diagnosis not present

## 2024-01-02 DIAGNOSIS — N393 Stress incontinence (female) (male): Secondary | ICD-10-CM | POA: Insufficient documentation

## 2024-01-02 DIAGNOSIS — Z87891 Personal history of nicotine dependence: Secondary | ICD-10-CM

## 2024-01-02 DIAGNOSIS — N812 Incomplete uterovaginal prolapse: Secondary | ICD-10-CM | POA: Diagnosis present

## 2024-01-02 DIAGNOSIS — Z01818 Encounter for other preprocedural examination: Secondary | ICD-10-CM

## 2024-01-02 DIAGNOSIS — J45909 Unspecified asthma, uncomplicated: Secondary | ICD-10-CM

## 2024-01-02 DIAGNOSIS — N8110 Cystocele, unspecified: Secondary | ICD-10-CM | POA: Diagnosis not present

## 2024-01-02 DIAGNOSIS — N888 Other specified noninflammatory disorders of cervix uteri: Secondary | ICD-10-CM | POA: Insufficient documentation

## 2024-01-02 HISTORY — DX: Stress incontinence (female) (male): N39.3

## 2024-01-02 HISTORY — DX: Moderate persistent asthma, uncomplicated: J45.40

## 2024-01-02 HISTORY — PX: XI ROBOTIC ASSISTED TOTAL HYSTERECTOMY WITH SACROCOLPOPEXY: SHX6825

## 2024-01-02 HISTORY — DX: Overactive bladder: N32.81

## 2024-01-02 HISTORY — DX: Personal history of other diseases of the female genital tract: Z87.42

## 2024-01-02 HISTORY — DX: Presence of spectacles and contact lenses: Z97.3

## 2024-01-02 HISTORY — DX: Incomplete uterovaginal prolapse: N81.2

## 2024-01-02 HISTORY — PX: CYSTOSCOPY: SHX5120

## 2024-01-02 LAB — TYPE AND SCREEN
ABO/RH(D): A POS
Antibody Screen: NEGATIVE

## 2024-01-02 LAB — POCT PREGNANCY, URINE: Preg Test, Ur: NEGATIVE

## 2024-01-02 SURGERY — HYSTERECTOMY, TOTAL, ROBOT-ASSISTED, WITH SACROCOLPOPEXY
Anesthesia: General | Site: Bladder

## 2024-01-02 MED ORDER — KETAMINE HCL 50 MG/5ML IJ SOSY
PREFILLED_SYRINGE | INTRAMUSCULAR | Status: AC
  Filled 2024-01-02: qty 5

## 2024-01-02 MED ORDER — KETOROLAC TROMETHAMINE 30 MG/ML IJ SOLN
INTRAMUSCULAR | Status: DC | PRN
  Administered 2024-01-02: 30 mg via INTRAVENOUS

## 2024-01-02 MED ORDER — SODIUM CHLORIDE 0.9 % IR SOLN
Status: DC | PRN
  Administered 2024-01-02: 1000 mL
  Administered 2024-01-02: 1000 mL via INTRAVESICAL

## 2024-01-02 MED ORDER — DEXAMETHASONE SODIUM PHOSPHATE 4 MG/ML IJ SOLN
INTRAMUSCULAR | Status: DC | PRN
  Administered 2024-01-02: 10 mg via INTRAVENOUS

## 2024-01-02 MED ORDER — HYDROMORPHONE HCL 2 MG/ML IJ SOLN
INTRAMUSCULAR | Status: AC
  Filled 2024-01-02: qty 1

## 2024-01-02 MED ORDER — ROCURONIUM BROMIDE 100 MG/10ML IV SOLN
INTRAVENOUS | Status: DC | PRN
  Administered 2024-01-02 (×5): 10 mg via INTRAVENOUS
  Administered 2024-01-02: 60 mg via INTRAVENOUS

## 2024-01-02 MED ORDER — FENTANYL CITRATE (PF) 100 MCG/2ML IJ SOLN
INTRAMUSCULAR | Status: AC
  Filled 2024-01-02: qty 2

## 2024-01-02 MED ORDER — POVIDONE-IODINE 10 % EX SWAB: 2.0000 | Freq: Once | CUTANEOUS | Status: DC

## 2024-01-02 MED ORDER — SCOPOLAMINE 1 MG/3DAYS TD PT72
1.0000 | MEDICATED_PATCH | TRANSDERMAL | Status: DC
  Administered 2024-01-02: 1.5 mg via TRANSDERMAL

## 2024-01-02 MED ORDER — HYDROMORPHONE HCL 1 MG/ML IJ SOLN: 0.2500 mg | INTRAMUSCULAR | Status: DC | PRN

## 2024-01-02 MED ORDER — OXYCODONE HCL 5 MG/5ML PO SOLN: 5.0000 mg | Freq: Once | ORAL | Status: DC | PRN

## 2024-01-02 MED ORDER — ONDANSETRON HCL 4 MG PO TABS: 4.0000 mg | ORAL_TABLET | Freq: Four times a day (QID) | ORAL | Status: DC | PRN

## 2024-01-02 MED ORDER — STERILE WATER FOR IRRIGATION IR SOLN
Status: DC | PRN
  Administered 2024-01-02: 1000 mL

## 2024-01-02 MED ORDER — ACETAMINOPHEN 500 MG PO TABS
ORAL_TABLET | ORAL | Status: AC
  Filled 2024-01-02: qty 2

## 2024-01-02 MED ORDER — IBUPROFEN 200 MG PO TABS: 600.0000 mg | ORAL_TABLET | Freq: Four times a day (QID) | ORAL | Status: DC

## 2024-01-02 MED ORDER — ONDANSETRON HCL 4 MG/2ML IJ SOLN
INTRAMUSCULAR | Status: DC | PRN
  Administered 2024-01-02: 4 mg via INTRAVENOUS

## 2024-01-02 MED ORDER — SCOPOLAMINE 1 MG/3DAYS TD PT72
MEDICATED_PATCH | TRANSDERMAL | Status: AC
  Filled 2024-01-02: qty 1

## 2024-01-02 MED ORDER — PHENAZOPYRIDINE HCL 100 MG PO TABS
200.0000 mg | ORAL_TABLET | ORAL | Status: AC
  Administered 2024-01-02: 200 mg via ORAL

## 2024-01-02 MED ORDER — ONDANSETRON HCL 4 MG/2ML IJ SOLN: 4.0000 mg | Freq: Once | INTRAMUSCULAR | Status: DC | PRN

## 2024-01-02 MED ORDER — ROCURONIUM BROMIDE 10 MG/ML (PF) SYRINGE
PREFILLED_SYRINGE | INTRAVENOUS | Status: AC
  Filled 2024-01-02: qty 10

## 2024-01-02 MED ORDER — CEFAZOLIN SODIUM-DEXTROSE 2-4 GM/100ML-% IV SOLN
2.0000 g | INTRAVENOUS | Status: AC
  Administered 2024-01-02: 2 g via INTRAVENOUS

## 2024-01-02 MED ORDER — LACTATED RINGERS IV SOLN: INTRAVENOUS | Status: DC

## 2024-01-02 MED ORDER — SIMETHICONE 80 MG PO CHEW: 80.0000 mg | CHEWABLE_TABLET | Freq: Four times a day (QID) | ORAL | Status: DC | PRN

## 2024-01-02 MED ORDER — DEXMEDETOMIDINE HCL IN NACL 80 MCG/20ML IV SOLN
INTRAVENOUS | Status: DC | PRN
  Administered 2024-01-02: 4 ug via INTRAVENOUS
  Administered 2024-01-02: 8 ug via INTRAVENOUS

## 2024-01-02 MED ORDER — GABAPENTIN 300 MG PO CAPS
ORAL_CAPSULE | ORAL | Status: AC
  Filled 2024-01-02: qty 1

## 2024-01-02 MED ORDER — FENTANYL CITRATE (PF) 100 MCG/2ML IJ SOLN
INTRAMUSCULAR | Status: DC | PRN
  Administered 2024-01-02 (×2): 50 ug via INTRAVENOUS

## 2024-01-02 MED ORDER — KETOROLAC TROMETHAMINE 30 MG/ML IJ SOLN: 30.0000 mg | Freq: Once | INTRAMUSCULAR | Status: DC | PRN

## 2024-01-02 MED ORDER — PHENAZOPYRIDINE HCL 100 MG PO TABS
ORAL_TABLET | ORAL | Status: AC
  Filled 2024-01-02: qty 2

## 2024-01-02 MED ORDER — HYDROMORPHONE HCL 1 MG/ML IJ SOLN
INTRAMUSCULAR | Status: DC | PRN
Start: 2024-01-02 — End: 2024-01-02
  Administered 2024-01-02 (×2): .5 mg via INTRAVENOUS

## 2024-01-02 MED ORDER — OXYCODONE HCL 5 MG PO TABS: 5.0000 mg | ORAL_TABLET | ORAL | Status: DC | PRN

## 2024-01-02 MED ORDER — ONDANSETRON HCL 4 MG/2ML IJ SOLN: 4.0000 mg | Freq: Four times a day (QID) | INTRAMUSCULAR | Status: DC | PRN

## 2024-01-02 MED ORDER — MIDAZOLAM HCL 2 MG/2ML IJ SOLN
INTRAMUSCULAR | Status: DC | PRN
  Administered 2024-01-02: 2 mg via INTRAVENOUS

## 2024-01-02 MED ORDER — CEFAZOLIN SODIUM-DEXTROSE 2-4 GM/100ML-% IV SOLN
INTRAVENOUS | Status: AC
  Filled 2024-01-02: qty 100

## 2024-01-02 MED ORDER — ACETAMINOPHEN 500 MG PO TABS
1000.0000 mg | ORAL_TABLET | ORAL | Status: AC
  Administered 2024-01-02: 1000 mg via ORAL

## 2024-01-02 MED ORDER — BUPIVACAINE HCL (PF) 0.25 % IJ SOLN
INTRAMUSCULAR | Status: DC | PRN
  Administered 2024-01-02: 25 mL

## 2024-01-02 MED ORDER — PROPOFOL 10 MG/ML IV BOLUS
INTRAVENOUS | Status: AC
  Filled 2024-01-02: qty 20

## 2024-01-02 MED ORDER — OXYCODONE HCL 5 MG PO TABS: 5.0000 mg | ORAL_TABLET | Freq: Once | ORAL | Status: DC | PRN

## 2024-01-02 MED ORDER — GLYCOPYRROLATE 0.2 MG/ML IJ SOLN
INTRAMUSCULAR | Status: DC | PRN
  Administered 2024-01-02: .1 mg via INTRAVENOUS

## 2024-01-02 MED ORDER — KETAMINE HCL 10 MG/ML IJ SOLN
INTRAMUSCULAR | Status: DC | PRN
  Administered 2024-01-02: 25 mg via INTRAVENOUS

## 2024-01-02 MED ORDER — METRONIDAZOLE 500 MG/100ML IV SOLN
500.0000 mg | INTRAVENOUS | Status: AC
  Administered 2024-01-02: 500 mg via INTRAVENOUS

## 2024-01-02 MED ORDER — MIDAZOLAM HCL 2 MG/2ML IJ SOLN
INTRAMUSCULAR | Status: AC
  Filled 2024-01-02: qty 2

## 2024-01-02 MED ORDER — SUGAMMADEX SODIUM 200 MG/2ML IV SOLN
INTRAVENOUS | Status: DC | PRN
  Administered 2024-01-02: 200 mg via INTRAVENOUS

## 2024-01-02 MED ORDER — GABAPENTIN 300 MG PO CAPS
300.0000 mg | ORAL_CAPSULE | ORAL | Status: AC
  Administered 2024-01-02: 300 mg via ORAL

## 2024-01-02 MED ORDER — PROPOFOL 10 MG/ML IV BOLUS
INTRAVENOUS | Status: DC | PRN
  Administered 2024-01-02: 180 mg via INTRAVENOUS

## 2024-01-02 MED ORDER — LIDOCAINE HCL (CARDIAC) PF 100 MG/5ML IV SOSY
PREFILLED_SYRINGE | INTRAVENOUS | Status: DC | PRN
  Administered 2024-01-02: 50 mg via INTRAVENOUS

## 2024-01-02 MED ORDER — METRONIDAZOLE 500 MG/100ML IV SOLN
INTRAVENOUS | Status: AC
  Filled 2024-01-02: qty 100

## 2024-01-02 MED ORDER — ACETAMINOPHEN 325 MG PO TABS: 650.0000 mg | ORAL_TABLET | ORAL | Status: DC | PRN

## 2024-01-02 SURGICAL SUPPLY — 73 items
BAG URINE DRAIN 2000ML AR STRL (UROLOGICAL SUPPLIES) IMPLANT
CATH FOLEY 3WAY 5CC 16FR (CATHETERS) ×2 IMPLANT
CHLORAPREP W/TINT 26 (MISCELLANEOUS) ×2 IMPLANT
COVER BACK TABLE 60X90IN (DRAPES) ×2 IMPLANT
COVER TIP SHEARS 8 DVNC (MISCELLANEOUS) ×2 IMPLANT
DEFOGGER SCOPE WARMER CLEARIFY (MISCELLANEOUS) ×2 IMPLANT
DERMABOND ADVANCED .7 DNX12 (GAUZE/BANDAGES/DRESSINGS) ×2 IMPLANT
DRAPE ARM DVNC X/XI (DISPOSABLE) ×8 IMPLANT
DRAPE COLUMN DVNC XI (DISPOSABLE) ×2 IMPLANT
DRAPE SHEET LG 3/4 BI-LAMINATE (DRAPES) ×2 IMPLANT
DRAPE SURG IRRIG POUCH 19X23 (DRAPES) ×2 IMPLANT
DRAPE UTILITY XL STRL (DRAPES) ×2 IMPLANT
DRIVER NDL LRG 8 DVNC XI (INSTRUMENTS) ×2 IMPLANT
DRIVER NDL MEGA SUTCUT DVNCXI (INSTRUMENTS) ×2 IMPLANT
DRIVER NDLE LRG 8 DVNC XI (INSTRUMENTS) ×2 IMPLANT
DRIVER NDLE MEGA SUTCUT DVNCXI (INSTRUMENTS) ×2 IMPLANT
DRSG COVADERM PLUS 2X2 (GAUZE/BANDAGES/DRESSINGS) IMPLANT
ELECT REM PT RETURN 9FT ADLT (ELECTROSURGICAL) ×2 IMPLANT
ELECTRODE REM PT RTRN 9FT ADLT (ELECTROSURGICAL) ×2 IMPLANT
FORCEPS BPLR 8 MD DVNC XI (FORCEP) ×2 IMPLANT
GAUZE 4X4 16PLY ~~LOC~~+RFID DBL (SPONGE) IMPLANT
GAUZE SPONGE 2X2 STRL 8-PLY (GAUZE/BANDAGES/DRESSINGS) IMPLANT
GLOVE BIOGEL PI IND STRL 6.5 (GLOVE) ×8 IMPLANT
GLOVE ECLIPSE 6.0 STRL STRAW (GLOVE) ×6 IMPLANT
GOWN STRL REUS W/TWL LRG LVL3 (GOWN DISPOSABLE) ×2 IMPLANT
GRASPER TIP-UP FEN DVNC XI (INSTRUMENTS) ×2 IMPLANT
HOLDER FOLEY CATH W/STRAP (MISCELLANEOUS) ×2 IMPLANT
IRRIG SUCT STRYKERFLOW 2 WTIP (MISCELLANEOUS) ×2 IMPLANT
IRRIGATION SUCT STRKRFLW 2 WTP (MISCELLANEOUS) ×2 IMPLANT
IV NS 1000ML BAXH (IV SOLUTION) IMPLANT
KIT PINK PAD W/HEAD ARE REST (MISCELLANEOUS) ×2 IMPLANT
KIT PINK PAD W/HEAD ARM REST (MISCELLANEOUS) ×2 IMPLANT
KIT TURNOVER CYSTO (KITS) ×2 IMPLANT
LEGGING LITHOTOMY PAIR STRL (DRAPES) ×2 IMPLANT
MANIFOLD NEPTUNE II (INSTRUMENTS) ×2 IMPLANT
MANIPULATOR ADVINCU DEL 2.5 PL (MISCELLANEOUS) IMPLANT
MANIPULATOR ADVINCU DEL 3.0 PL (MISCELLANEOUS) IMPLANT
MANIPULATOR ADVINCU DEL 3.5 PL (MISCELLANEOUS) IMPLANT
MANIPULATOR ADVINCU DEL 4.0 PL (MISCELLANEOUS) IMPLANT
MESH VERTESSA LITE -Y 2X4X3 (Mesh General) IMPLANT
NDL INSUFFLATION 14GA 120MM (NEEDLE) ×2 IMPLANT
NEEDLE INSUFFLATION 14GA 120MM (NEEDLE) ×2 IMPLANT
OBTURATOR OPTICAL STND 8 DVNC (TROCAR) ×2 IMPLANT
OBTURATOR OPTICALSTD 8 DVNC (TROCAR) ×2 IMPLANT
PACK CYSTO (CUSTOM PROCEDURE TRAY) ×2 IMPLANT
PACK ROBOT WH (CUSTOM PROCEDURE TRAY) ×2 IMPLANT
PACK ROBOTIC GOWN (GOWN DISPOSABLE) ×2 IMPLANT
PAD OB MATERNITY 4.3X12.25 (PERSONAL CARE ITEMS) ×2 IMPLANT
PAD PREP 24X48 CUFFED NSTRL (MISCELLANEOUS) ×2 IMPLANT
POUCH LAPAROSCOPIC INSTRUMENT (MISCELLANEOUS) IMPLANT
PROTECTOR NERVE ULNAR (MISCELLANEOUS) ×2 IMPLANT
SCISSORS MNPLR CVD DVNC XI (INSTRUMENTS) ×2 IMPLANT
SCRUB CHG 4% DYNA-HEX 4OZ (MISCELLANEOUS) ×2 IMPLANT
SEAL UNIV 5-12 XI (MISCELLANEOUS) ×10 IMPLANT
SEALER VESSEL EXT DVNC XI (MISCELLANEOUS) IMPLANT
SET IRRIG Y TYPE TUR BLADDER L (SET/KITS/TRAYS/PACK) ×2 IMPLANT
SET TUBE SMOKE EVAC HIGH FLOW (TUBING) ×2 IMPLANT
SLEEVE SCD COMPRESS KNEE MED (STOCKING) ×2 IMPLANT
SPIKE FLUID TRANSFER (MISCELLANEOUS) ×4 IMPLANT
SUT GORETEX NAB #0 THX26 36IN (SUTURE) ×2 IMPLANT
SUT MNCRL AB 4-0 PS2 18 (SUTURE) ×4 IMPLANT
SUT MON AB 2-0 SH 27 (SUTURE) ×2 IMPLANT
SUT STRATA PDS 2-0 23 CT-1 (SUTURE) IMPLANT
SUT STRATA PDS 2-0 23 CT-2.5 (SUTURE) ×4 IMPLANT
SUT V-LOC BARB 180 2/0GR9 GS23 (SUTURE) ×4 IMPLANT
SUT VIC AB 0 CT1 27XBRD ANBCTR (SUTURE) IMPLANT
SUT VIC AB 0 CT1 27XBRD ANTBC (SUTURE) ×4 IMPLANT
SUT VLOC 180 0 6IN GS21 (SUTURE) IMPLANT
SUT VLOC 180 0 9IN GS21 (SUTURE) ×2 IMPLANT
SUTURE STRAT PDS 2-0 23 CT-2.5 (SUTURE) ×4 IMPLANT
SUTURE V-LC BRB 180 2/0GR9GS23 (SUTURE) IMPLANT
TOWEL OR 17X24 6PK STRL BLUE (TOWEL DISPOSABLE) ×2 IMPLANT
WATER STERILE IRR 1000ML POUR (IV SOLUTION) IMPLANT

## 2024-01-02 NOTE — Transfer of Care (Signed)
Immediate Anesthesia Transfer of Care Note  Patient: Amber Wilkins  Procedure(s) Performed: XI ROBOTIC ASSISTED TOTAL HYSTERECTOMY WITH BILATERAL SALPINGECOMY AND SACROCOLPOPEXY (Abdomen) CYSTOSCOPY (Bladder)  Patient Location: PACU  Anesthesia Type:General  Level of Consciousness: awake, alert , and patient cooperative  Airway & Oxygen Therapy: Patient Spontanous Breathing and Patient connected to nasal cannula oxygen  Post-op Assessment: Report given to RN and Post -op Vital signs reviewed and stable  Post vital signs: Reviewed and stable  Last Vitals:  Vitals Value Taken Time  BP 159/90 01/02/24 1515  Temp    Pulse 85 01/02/24 1516  Resp 18 01/02/24 1516  SpO2 97 % 01/02/24 1516  Vitals shown include unfiled device data.  Last Pain:  Vitals:   01/02/24 0943  TempSrc: Oral  PainSc: 0-No pain      Patients Stated Pain Goal: 5 (01/02/24 0943)  Complications: No notable events documented.

## 2024-01-02 NOTE — Anesthesia Procedure Notes (Signed)
Procedure Name: Intubation Date/Time: 01/02/2024 11:40 AM  Performed by: Earmon Phoenix, CRNAPre-anesthesia Checklist: Patient identified, Emergency Drugs available, Suction available, Patient being monitored and Timeout performed Patient Re-evaluated:Patient Re-evaluated prior to induction Oxygen Delivery Method: Circle system utilized Preoxygenation: Pre-oxygenation with 100% oxygen Induction Type: IV induction Ventilation: Mask ventilation without difficulty Laryngoscope Size: Mac and 3 Grade View: Grade I Tube type: Oral Tube size: 7.0 mm Number of attempts: 1 Airway Equipment and Method: Stylet Placement Confirmation: ETT inserted through vocal cords under direct vision, positive ETCO2 and breath sounds checked- equal and bilateral Secured at: 20 cm Tube secured with: Tape Dental Injury: Teeth and Oropharynx as per pre-operative assessment

## 2024-01-02 NOTE — Op Note (Signed)
 Operative Note  Preoperative Diagnosis: anterior vaginal prolapse, posterior vaginal prolapse, and uterovaginal prolapse, incomplete  Postoperative Diagnosis: same  Procedures performed:  Robotic assisted total laparoscopic hysterectomy with bilateral salpingectomy, sacrocolpopexy [name], cystoscopy  Implants:  Implant Name Type Inv. Item Serial No. Manufacturer Lot No. LRB No. Used Action  MESH Grayland Ormond [id number] Mesh General MESH Arlys John  [facility] MEDICAL [id number] N/A 1 Implanted    Attending Surgeon: Lanetta Inch, MD  Assistant: Bosie Helper, RNFA  Anesthesia: General endotracheal  Findings: 1. On vaginal exam, stage II prolapse present  2. On cystoscopy, normal bladder and urethral mucosa without injury or lesion. Brisk bilateral ureteral efflux present.    3. On laparoscopy, adhesions were noted at the following sites: from the bowel to the left pelvic sidewall, and to the right lateral abdominal wall.  Specimens:  ID Type Source Tests Collected by Time Destination  1 : cervix, uterus, bilateral fallopian tubes Tissue PATH Gyn benign resection SURGICAL PATHOLOGY Marguerita Beards, MD 01/02/2024 1304     Estimated blood loss: 50 mL  IV fluids: 1000 mL  Urine output: 200 mL  Complications: none  Procedure in Detail: After informed consent was obtained, the patient was taken to the operating room, where general anesthesia was induced and found to be adequate. She was placed in dorsolithotomy position in yellowfin stirrups. Her hips were noted not to be hyperflexed or hyperextended. Her arms were padded with gel pads and tucked to her sides. Her hands were surrounded by foam. A padded strap was placed across her chest with foam between the pad and her skin. She was noted to be appropriately positioned with all pressure points well padded and off tension. A tilt test showed no slippage. She was prepped and draped in the usual  sterile fashion. A uterine manipulator was placed in the uterus after sounding to 8 cm, an appropriately sized Koh ring was placed around the cervix, and a pneumo-occluder balloon was positioned in the vagina for later use.  A sterile Foley catheter was inserted.   0.25% plain Marcaine was injected in the supraumbilical  area and an 8 mm supraumbilical skin incision was made with the scalpel.  A Veress needle was inserted into the incision, CO2 insufflation was started, a low opening pressure was noted, and pneumoperitoneum was obtained. The Veress needle was removed and a 8mm robotic trocar was placed. The robotic camera was inserted and intraperitoneal placement was confirmed. Survey of the abdomen and pelvis revealed the findings as noted above, specifically adhesions of the bowel to the lateral pelvic sidewall on the left and the right lateral mid abdominal sidewall. The sacrum appeared to be free of any adhesive disease. After determining placement for the other ports, Local anesthetic was injected at each site and two 8 mm incisions were made for robotic ports at 10 cm lateral to and at the level of the umbilical port. Two additional 8 mm incisions were made 10 cm lateral to these and 30 degrees down followed by 8 mm robotic ports - the right side for an assistant port. All trocars were placed sequentially under direct visualization of the camera. The patient was placed in Trendelenburg. The robot was docked on the patient's right side. Monopolar endoshears were placed in the right arm, a [place] bipolar grasper was placed in the 2nd arm of the patient's left side, and a Tip up grasper was placed in the 3rd arm on the patient's left side.  Attention was then turned to the sacral promontory. The peritoneum overlying the sacral promontory was tented up, dissected sharply with monopolar scissors and electrosurgery using layer by layer technique. The peritoneal incision was extended down to the posterior  cul-de-sac. This was performed with care to avoid the ureter on the right side and the sigmoid colon and its mesentary on the left side. Two transverse sutures of CV2 Gortex were placed in the anterior longitudinal ligament.  Attention was then turned to the robotic hysterectomy and salpingectomy. The ureter was identified and was found to be well away from the planned site of incision. The right mesosalpinx was cauterized and cut. The uteroovarian was then cauterized and transected. The right round ligament was grasped, cauterized, and transected with electrocautery. The anterior and posterior leaves of the broad ligament were taken down with cautery and sharp dissection. The uterine artery was skeletonized and the bladder flap was created on the right side with a combination of electrosurgery and sharp dissection. The KOH ring was identified. The right uterine artery was clamped, cauterized, and transected. In a similar fashion, the left side was taken down. Further sharp dissection with combination of cautery was performed to further develop the bladder flap. At this point, the KOH ring was completely hugging the cervix. The pneumo-occluder balloon in the vagina was inflated to maintain pneumoperitoneum. A colpotomy was performed with electrosurgical cutting current and the uterus and cervix were completely amputated from the vagina. The specimen was delivered through the vagina. The posterior portion of the vaginal cuff was then grasped and pulled up to maintain pneumoperitoneum. The pneumo-occluder balloon was then replaced in the vagina. The right hand instrument was changed to a suture-cut needle driver. The vaginal cuff was then closed using a 0 V-loc suture in two layers.    The right hand instrument was replaced with monopolar endoshears. With a lucite probe in the vagina, the anterior vaginal dissection was then performed with sharp dissection and electrosurgery. The posterior vaginal dissection was  then performed with sharp dissection and electrosurgery in order to dissect the rectum away from the posterior vagina.  A "Y" mesh was then inserted into the abdomen after trimming to appropriate size. With the probe in the vagina, the anterior leaf of the Y mesh was affixed to the anterior portion of the vagina using a 2-0 v-loc suture in a spiral pattern to distribute the suture evenly across the surface of the anterior mesh leaf. In a similar fashion, the posterior leaf of the Y mesh was attached to the posterior surface of the vagina with 2-0 v-loc suture.  The distal end of the mesh was then brought to overlie the sacrum. The correct amount of tension was determined in order to elevate the vagina, but not put the mesh under tension. The distal end of the mesh was then affixed to the anterior longitudinal sacral ligament using two interrupted transverse stitches of CV2 Gortex. The excess distal mesh was then cut and removed. The peritoneum was reapproximated over the mesh using 2-0 monocryl. The bladder flap was incorporated to completely retroperitonealize the mesh. All pedicles were carefully inspected and noted to be hemostatic as the CO2 gas was deflated. All instruments were removed from the patient's abdomen.   The Foley catheter was removed.  A 70-degree cystoscope was introduced, and 360-degree inspection revealed no injury, lesion or foreign body in the bladder. Brisk bilateral ureteral efflux was noted with the assistance of pyridium.  The bladder was drained and the cystoscope  was removed.  The Foley catheter was replaced.  The robot was undocked. The CO2 gas was removed and the ports were removed.  The skin incisions were closed with subcutaneous stitches of 4-0 Monocryl and covered with skin glue.  Sponge, lap, and needle counts were correct x 2. The patient tolerated the procedure well. She was awakened from anesthesia and transferred to the recovery room in stable condition.   Marguerita Beards, MD

## 2024-01-02 NOTE — Discharge Instructions (Signed)

## 2024-01-02 NOTE — Interval H&P Note (Signed)
History and Physical Interval Note:  01/02/2024 10:49 AM  Amber Wilkins  has presented today for surgery, with the diagnosis of uterovaginal prolapse, incomplete.  The various methods of treatment have been discussed with the patient and family. After consideration of risks, benefits and other options for treatment, the patient has consented to  Procedure(s) with comments: XI ROBOTIC ASSISTED TOTAL HYSTERECTOMY WITH BILATERAL SALPINGECOMY AND SACROCOLPOPEXY (N/A)  CYSTOSCOPY (N/A) as a surgical intervention.  The patient's history has been reviewed, patient examined, no change in status, stable for surgery.  I have reviewed the patient's chart and labs.  Questions were answered to the patient's satisfaction.     Marguerita Beards

## 2024-01-02 NOTE — Anesthesia Postprocedure Evaluation (Signed)
Anesthesia Post Note  Patient: Amber Wilkins  Procedure(s) Performed: XI ROBOTIC ASSISTED TOTAL HYSTERECTOMY WITH BILATERAL SALPINGECOMY AND SACROCOLPOPEXY (Abdomen) CYSTOSCOPY (Bladder)     Patient location during evaluation: PACU Anesthesia Type: General Level of consciousness: awake and alert, oriented and patient cooperative Pain management: pain level controlled Vital Signs Assessment: post-procedure vital signs reviewed and stable Respiratory status: spontaneous breathing, nonlabored ventilation and respiratory function stable Cardiovascular status: blood pressure returned to baseline and stable Postop Assessment: no apparent nausea or vomiting Anesthetic complications: no   No notable events documented.  Last Vitals:  Vitals:   01/02/24 1515 01/02/24 1530  BP: (!) 159/90 (!) 153/93  Pulse: 94 82  Resp: 20 17  Temp: 37.1 C   SpO2: 97% 96%    Last Pain:  Vitals:   01/02/24 1530  TempSrc:   PainSc: 0-No pain                 Lannie Fields

## 2024-01-03 ENCOUNTER — Encounter (HOSPITAL_BASED_OUTPATIENT_CLINIC_OR_DEPARTMENT_OTHER): Payer: Self-pay | Admitting: Obstetrics and Gynecology

## 2024-01-04 ENCOUNTER — Telehealth: Payer: Self-pay | Admitting: Obstetrics and Gynecology

## 2024-01-04 LAB — SURGICAL PATHOLOGY

## 2024-01-04 NOTE — Telephone Encounter (Signed)
Amber Wilkins underwent Robotic assisted total laparoscopic hysterectomy with bilateral salpingectomy, sacrocolpopexy [name], cystoscopy on 01/02/24.   She passed her voiding trial.  was backfilled into the bladder Voided  PVR by bladder scan was 30ml.   She was discharged without a catheter. Please call her for a routine post op check. Thanks!  Marguerita Beards, MD

## 2024-01-08 NOTE — Telephone Encounter (Signed)
 Called and spoke to patient Reports she is pain free and not taking any narcotic medications.  Patient reports minimal bleeding.  Patient reports she had a bowel movement the day after surgery. She reports she used Miralax right after surgery but has not needed it since.  Reports she can pee normally and has not had any issues.  Reports she has been walking and is sleeping well. States she is just being careful with her movements.  Patient reports she called and spoke to the Dr. On call this weekend due to a pupil issue and was told it was the nausea medication. She reports Sunday her pupil had returned to normal.  Patient reports she also had a rash where the iodine was and she put cortisone on the rash and that has since gone away. Reports she did not put the cortisone on the sutures.  Overall patient is doing well and will call if she thinks she needs anything.

## 2024-01-12 ENCOUNTER — Telehealth: Payer: Self-pay

## 2024-01-12 ENCOUNTER — Telehealth: Payer: Medicaid Other | Admitting: Nurse Practitioner

## 2024-01-12 ENCOUNTER — Other Ambulatory Visit: Payer: Self-pay | Admitting: Nurse Practitioner

## 2024-01-12 MED ORDER — CETIRIZINE HCL 10 MG PO TABS: 10.0000 mg | ORAL_TABLET | Freq: Every day | ORAL | 3 refills | Status: DC

## 2024-01-12 MED ORDER — FLUTICASONE PROPIONATE HFA 110 MCG/ACT IN AERO: 2.0000 | INHALATION_SPRAY | Freq: Two times a day (BID) | RESPIRATORY_TRACT | 12 refills | Status: AC

## 2024-01-12 NOTE — Telephone Encounter (Signed)
 I called patient per request of Maisee to check if she just needed prescriptions refilled.  LVM for patient to return call.

## 2024-01-12 NOTE — Telephone Encounter (Signed)
 Patient returned call and all she is requesting is a Rx reffill of Zyrtec and Flovent. Vala wanted to updated Amber Wilkins that she had a hysterectomy on January 02, 2024 and it was done laparoscopic and had her uterus, tubes, and cervix removed and had a mesh implanted (documented in Hx). I told patient that I will also cancel her appointment and send [facility].   Requesting: Zyrtec and Flovent Last Visit: 04/19/23 Next Visit: Visit date not found Last Refill: 06/29/22, 02/13/23  Please Advise

## 2024-01-12 NOTE — Telephone Encounter (Signed)
 I called patient and left voicemail that prescriptions refilled and sent to pharmacy.

## 2024-01-12 NOTE — Telephone Encounter (Unsigned)
 Copied from CRM [id number]. Topic: Clinical - Medication Refill >> Jan 12, 2024  8:36 AM Marica Otter wrote: Most Recent Primary Care Visit:  Provider: Rodman Pickle A  Department: [facility]  Visit Type: OFFICE VISIT  Date: 04/19/2023  Medication: fluticasone (FLOVENT HFA) 110 MCG/ACT inhaler, cetirizine (ZYRTEC) 10 MG tablet   Has the patient contacted their pharmacy? Yes, Has to reach out to provider (Agent: If no, request that the patient contact the pharmacy for the refill. If patient does not wish to contact the pharmacy document the reason why and proceed with request.) (Agent: If yes, when and what did the pharmacy advise?)  Is this the correct pharmacy for this prescription? Yes If no, delete pharmacy and type the correct one.  This is the patient's preferred pharmacy:  [facility] [phone] [place], [place] - [address] [place] [address] Phone: [phone] Fax: [phone]   Has the prescription been filled recently? No  Is the patient out of the medication? Yes  Has the patient been seen for an appointment in the last year OR does the patient have an upcoming appointment? Yes  Can we respond through MyChart? Yes  Agent: Please be advised that Rx refills may take up to 3 business days. We ask that you follow-up with your pharmacy.

## 2024-02-14 ENCOUNTER — Encounter: Payer: Self-pay | Admitting: Obstetrics and Gynecology

## 2024-02-14 ENCOUNTER — Ambulatory Visit (INDEPENDENT_AMBULATORY_CARE_PROVIDER_SITE_OTHER): Payer: Medicaid Other | Admitting: Obstetrics and Gynecology

## 2024-02-14 VITALS — BP 142/83 | HR 85

## 2024-02-14 DIAGNOSIS — Z9889 Other specified postprocedural states: Secondary | ICD-10-CM

## 2024-02-14 DIAGNOSIS — N3281 Overactive bladder: Secondary | ICD-10-CM

## 2024-02-14 DIAGNOSIS — Z48816 Encounter for surgical aftercare following surgery on the genitourinary system: Secondary | ICD-10-CM

## 2024-02-14 NOTE — Progress Notes (Signed)
 [facility]  Date of Visit: 02/14/2024  History of Present Illness: Ms. Amber Wilkins is a 33 Wilkins.o. female scheduled today for a post-operative visit.   Surgery: s/p Robotic assisted total laparoscopic hysterectomy with bilateral salpingectomy, sacrocolpopexy Amber Wilkins), cystoscopy on 01/02/24  She passed her postoperative void trial.   Postoperative course has been uncomplicated.   Today she reports she is feeling well. Has a little abdominal soreness but not needing pain medication. Still having leakage but it has improved, only leaks a small amount (quarter size on the pad). Has been working on [employer] and pelvic floor exercises. Drinking 1 cup coffee, water and occasional soda.   UTI in the last 6 weeks? No  Pain? No  She has returned to her normal activity (except for postop restrictions) Vaginal bulge? No  Stress incontinence: No  Urgency/frequency: No , but does wake 1-2 times per night (drinks water at night).  Urge incontinence: Yes  Voiding dysfunction: No  Bowel issues: No   Subjective Success: Do you usually have a bulge or something falling out that you can see or feel in the vaginal area? No  Retreatment Success: Any retreatment with surgery or pessary for any compartment? No   Pathology results: CERVIX, UTERUS, BILATERAL FALLOPIAN TUBES, RESECTION:  Cervix:           Unremarkable.            Negative for dysplasia or malignancy.        Endocervix:            Nabothian cysts.            Negative for hyperplasia, atypia or malignancy.        Endometrium:            Benign proliferative endometrium.            Negative for hyperplasia, atypia or malignancy.        Myometrium:            Unremarkable.            Negative for malignancy.        Serosa:            Unremarkable.            Negative for malignancy.        Bilateral fallopian tubes:            Benign fimbriated fallopian tubes.            Negative for malignancy.   Medications: She  has a current medication list which includes the following prescription(s): albuterol, cetirizine, fluticasone, and naphazoline hcl.   Allergies: Patient is allergic to penicillins and latex.   Physical Exam: BP (!) 142/83   Pulse 85   Abdomen: soft, non-tender, without masses or organomegaly Laparoscopic Incisions: healing well.  Cough stress test negative Pelvic Examination: Vagina: Incisions healing well. Sutures are present at the cuff and there is not granulation tissue. No tenderness along the anterior or posterior vagina. No apical tenderness. No pelvic masses. No visible or palpable mesh.  POP-Q: POP-Q  -3                                            Aa   -3  Ba  -9                                              C   2.5                                            Gh  5                                            Pb  9                                            tvl   -3                                            Ap  -3                                            Bp                                                 D    ---------------------------------------------------------  Assessment and Plan:  1. Overactive bladder   2. Post-operative state    - No SUI noted on exam today or in prior UDS so suspect she has some urge incontinence. Discussed options of pelvic PT or medication. She wants to start with pelvic PT and will f/u for further treatment if not improved.  - Pathology results were reviewed with the patient today and she verbalized understanding that the results were benign.  - Can resume regular activity including exercise. Discussed avoidance of heavy lifting and straining long term to reduce the risk of recurrence. She does lift up to 50 lbs at work.  - Wait an additional 6 weeks for intercourse - Recommend annual GYN care with general gynecology  Follow up as needed  Marguerita Beards, MD

## 2024-02-26 ENCOUNTER — Other Ambulatory Visit: Payer: Self-pay | Admitting: Nurse Practitioner

## 2024-02-26 NOTE — Telephone Encounter (Signed)
 Requesting: VENTOLIN HFA 90 MCG INHALER  Last Visit: 04/19/2023 Next Visit: Visit date not found Last Refill: 02/13/2023  Please Advise

## 2024-03-20 ENCOUNTER — Telehealth: Payer: Self-pay

## 2024-03-20 NOTE — Telephone Encounter (Signed)
 Spoke to the patient. She is going to come in to the office to sign a new copy of the pre-hospital medical form before you have a hysterectomy.

## 2024-04-16 ENCOUNTER — Ambulatory Visit: Admitting: Nurse Practitioner

## 2024-05-01 NOTE — Therapy (Signed)
 OUTPATIENT PHYSICAL THERAPY FEMALE PELVIC EVALUATION   Patient Name: Amber Wilkins MRN: [id number] DOB:[birth year], 33 y.o., female Today's Date: 05/02/2024  END OF SESSION:  PT End of Session - 05/02/24 1408     Visit Number 1    Date for PT Re-Evaluation 11/01/24    Authorization Type [employer]    PT Start Time 1400    PT Stop Time 1445    PT Time Calculation (min) 45 min    Activity Tolerance Patient tolerated treatment well    Behavior During Therapy WFL for tasks assessed/performed          Past Medical History:  Diagnosis Date   History of abnormal cervical Pap smear    Moderate persistent asthma without complication    followed by pcp   OAB (overactive bladder)    Seasonal allergies    SUI (stress urinary incontinence, female)    Uterovaginal prolapse, incomplete    Wears glasses    Past Surgical History:  Procedure Laterality Date   APPENDECTOMY  09/25/2001   @[facility]  by dr Melven Stable. Lynder Sanger;  open   CYSTOSCOPY N/A 01/02/2024   Procedure: Orin Birk;  Surgeon: Arma Lamp, MD;  Location: [facility];  Service: Gynecology;  Laterality: N/A;   WISDOM TOOTH EXTRACTION     XI ROBOTIC ASSISTED TOTAL HYSTERECTOMY WITH SACROCOLPOPEXY N/A 01/02/2024   Procedure: XI ROBOTIC ASSISTED TOTAL HYSTERECTOMY WITH BILATERAL SALPINGECOMY AND SACROCOLPOPEXY;  Surgeon: Arma Lamp, MD;  Location: [facility];  Service: Gynecology;  Laterality: N/A;  Total time requested is 3 hours   Patient Active Problem List   Diagnosis Date Noted   Uterovaginal prolapse, incomplete 01/02/2024   Moderate persistent asthma without complication 06/29/2022   Female bladder prolapse 06/29/2022   Obesity, Class I, BMI 30.0-34.9 (see actual BMI) 06/07/2016    PCP: Odette Benjamin, NP  REFERRING PROVIDER: Arma Lamp, MD   REFERRING DIAG: N32.81 (ICD-10-CM) - Overactive bladder  THERAPY DIAG:  Muscle weakness (generalized) - Plan: PT  plan of care cert/re-cert  Pelvic pain - Plan: PT plan of care cert/re-cert  Rationale for Evaluation and Treatment: Rehabilitation  ONSET DATE: 2017  SUBJECTIVE:                                                                                                                                                                                           SUBJECTIVE STATEMENT: 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 She noticed an odder and wet spot. I did a pessary for 2 years. Patient  had a hysterectomy 01/02/24. Patient has healed but still  leaking urine. Happens more with stress and not emptying her bladder fully.  Fluid intake: water , 2 cans of soda per day, coffee  PAIN:  Are you having pain? Yes NPRS scale: 1/10 Pain location: vaginal and bladder area  Pain type: light pinching Pain description: intermittent   Aggravating factors: when bladder is full Relieving factors: empty her bladder  PRECAUTIONS: None  RED FLAGS: None   WEIGHT BEARING RESTRICTIONS: No  FALLS:  Has patient fallen in last 6 months? No  OCCUPATION: receptionist and sits alot  ACTIVITY LEVEL : 1-2 times per week does yoga  PLOF: Independent  PATIENT GOALS: stop the urinary leakage, control the pelvic floor  PERTINENT HISTORY:  XI Robotic Assisted Total Hysterectomy with Bilateral Salpingecomy and Sacrocolpopexy 01/02/24 Sexual abuse: No  BOWEL MOVEMENT: no difficulty  URINATION: Pain with urination: No Fully empty bladder: Nowhen bladder is extra full feels like she has to push at the end of urination due to the sensation to urinate is still there Stream: Strong Urgency: No Frequency: average Leakage: Sneezing and hard to tell, if she has held the urine for a long time, she feels moist in the vaginal area Pads: Yes: panty liner, some days it is dry  INTERCOURSE:  Ability to have vaginal penetration Yes  Pain with intercourse: Deep Penetration, with on all fours  and entrance from behind DrynessNo Climax: yes Marinoff Scale: 1/3  PREGNANCY: Vaginal deliveries 1 Tearing No  PROLAPSE: None   OBJECTIVE:  Note: Objective measures were completed at Evaluation unless otherwise noted.  DIAGNOSTIC FINDINGS:  none  PATIENT SURVEYS:  PFIQ-7: 17 UIQ-7: 19  COGNITION: Overall cognitive status: Within functional limits for tasks assessed     SENSATION: Light touch: Appears intact   FUNCTIONAL TESTS:  Squat without difficulty and stand on one leg without difficulty   POSTURE: decreased lumbar lordosis, posterior pelvic tilt, and clenches her buttocks   LUMBARAROM/PROM: lumbar ROM is full   LOWER EXTREMITY [id number] hip ROM is full   LOWER EXTREMITY MMT: Bilateral hip strength is 5/5  PALPATION:   General: increased rib angle and difficulty with bringing lower ribs together  Pelvic Alignment: ASIS Are equal  Abdominal: tenderness in lower abdomen, palpate above the pubic bone make the sensation to urinate; restriction around the abdominal and are well healed                External Perineal Exam: redness around the vulva area; the labia minora is starting to fuse to the labia majora                             Internal Pelvic Floor: tightness on the sides of the introitus; tenderness along the bilateral levator ani, left obturator internist, sides of the urethra and left side of the urethra  Patient confirms identification and approves PT to assess internal pelvic floor and treatment Yes  No emotional/communication barriers or cognitive limitation. Patient is motivated to learn. Patient understands and agrees with treatment goals and plan. PT explains patient will be examined in standing, sitting, and lying down to see how their muscles and joints work. When they are ready, they will be asked to remove their underwear so PT can examine their perineum. The patient is also given the option of providing their own chaperone as one  is not provided in our facility. The patient also has the right and is explained the right to defer or refuse any part of  the evaluation or treatment including the internal exam. With the patient's consent, PT will use one gloved finger to gently assess the muscles of the pelvic floor, seeing how well it contracts and relaxes and if there is muscle symmetry. After, the patient will get dressed and PT and patient will discuss exam findings and plan of care. PT and patient discuss plan of care, schedule, attendance policy and HEP activities.   PELVIC MMT:   MMT eval  Vaginal 3/5 anterior and posterior; 2/5 laterally  (Blank rows = not tested)         PROLAPSE: none  TODAY'S TREATMENT:                                                                                                                              DATE: 05/02/24  EVAL Examination completed, findings reviewed, pt educated on POC, HEP, and female pelvic floor anatomy, reasoning with pelvic floor assessment internally with pt consent. Pt motivated to participate in PT and agreeable to attempt recommendations.     PATIENT EDUCATION:  05/02/24 Education details: Access Code: [email or web address] Person educated: Patient Education method: Explanation, Demonstration, Tactile cues, Verbal cues, and Handouts Education comprehension: verbalized understanding, returned demonstration, verbal cues required, tactile cues required, and needs further education  HOME EXERCISE PROGRAM: 05/02/24 Access Code: [email or web address] URL: [email or web address] Date: 05/02/2024 Prepared by: Marsha Skeen  Exercises - Seated Pelvic Floor Contraction  - 3 x daily - 7 x weekly - 1 sets - 10 reps - 10 sec hold - Seated Quick Flick Pelvic Floor Contractions  - 3 x daily - 7 x weekly - 1 sets - 10 reps  ASSESSMENT:  CLINICAL IMPRESSION: Patient is a 33 y.o. female who was seen today for physical therapy evaluation and treatment for overactive bladder. Patient  reports she has had urinary leakage since 2017. She wore a pessary for 2 years. She had XI Robotic Assisted Total Hysterectomy with Bilateral Salpingecomy and Sacrocolpopexy on 01/02/24. Since then she will leak urine with a sneeze and sometimes is not aware of the urinary leakage. She has lower abdominal and vaginal pain that is pinching at level 1/10. She does have pain deep in the pelvic floor with penile penetration. She has tenderness along the lower abdomen and when the therapist palpates above the pubic bone she has the urge to urinate. Sometimes after urinating she will still have the urge to urinate. Patient will benefit from skilled therapy to reduce urinary leakage, improve strength and coordination of the muscles and improve tissue mobility.   OBJECTIVE IMPAIRMENTS: decreased activity tolerance, decreased coordination, decreased strength, increased fascial restrictions, and pain.   ACTIVITY LIMITATIONS: continence and toileting  PARTICIPATION LIMITATIONS: interpersonal relationship, shopping, and community activity  PERSONAL FACTORS: Fitness and 1 comorbidity: XI Robotic Assisted Total Hysterectomy with Bilateral Salpingecomy and Sacrocolpopexy 01/02/24 are also affecting patient's functional outcome.   REHAB POTENTIAL: Excellent  CLINICAL DECISION MAKING: Stable/uncomplicated  EVALUATION COMPLEXITY: Low   GOALS:  Goals reviewed with patient? Yes  SHORT TERM GOALS: Target date: 05/30/24  Patient independent with initial HEP for pelvic floor strength to reduce urine.  Baseline: not educated yet Goal status: INITIAL   LONG TERM GOALS: Target date: 11/01/24  Patient independent with core and pelvic floor advanced HEP to further reduce urinary leakage.  Baseline: not educated yet Goal status: INITIAL  2.  Patient is able to sneeze without leaking urine due to strength is 4/5 with a circular contraction.  Baseline: leaks with a sneeze, pelvic floor strength laterally is 2/5 and  anterior and posterior is 3/5.  Goal status: INITIAL  3.  Patient is able to go through the day without wearing a pad due to the reduction of urinary leakage without her realizing but had an odor.  Baseline: leaks urine without feeling it and has an odor Goal status: INITIAL  4.  UIQ-7 score is </= 10 due to reduction of urinary leakage and frustration of it happening when in public.  Baseline:UIQ-7: 19  Goal status: INITIAL  5.  Patients lower abdominal pain decreased none when her bladder fills due to improve tissue mobility.  Baseline: pain level 1/10 Goal status: INITIAL  PLAN:  PT FREQUENCY: 1-2x/week  PT DURATION: 6 months  PLANNED INTERVENTIONS: 97110-Therapeutic exercises, 97530- Therapeutic activity, 97112- Neuromuscular re-education, 97535- Self Care, [id number]- Manual therapy, Patient/Family education, Joint mobilization, Spinal mobilization, Cryotherapy, Moist heat, and Biofeedback  PLAN FOR NEXT SESSION: manual work to abdomen, work on International aid/development worker, upper abdominal contraction, work on scars, diaphragmatic breathing   Marsha Skeen, PT 05/02/24 3:01 PM

## 2024-05-02 ENCOUNTER — Other Ambulatory Visit: Payer: Self-pay

## 2024-05-02 ENCOUNTER — Encounter: Payer: Self-pay | Admitting: Physical Therapy

## 2024-05-02 ENCOUNTER — Encounter: Attending: Obstetrics and Gynecology | Admitting: Physical Therapy

## 2024-05-02 DIAGNOSIS — M6281 Muscle weakness (generalized): Secondary | ICD-10-CM | POA: Insufficient documentation

## 2024-05-02 DIAGNOSIS — R102 Pelvic and perineal pain: Secondary | ICD-10-CM | POA: Insufficient documentation

## 2024-05-09 ENCOUNTER — Encounter: Payer: Self-pay | Admitting: Physical Therapy

## 2024-05-09 DIAGNOSIS — M6281 Muscle weakness (generalized): Secondary | ICD-10-CM

## 2024-05-09 DIAGNOSIS — R102 Pelvic and perineal pain: Secondary | ICD-10-CM

## 2024-05-09 NOTE — Patient Instructions (Addendum)

## 2024-05-09 NOTE — Therapy (Signed)
 OUTPATIENT PHYSICAL THERAPY FEMALE PELVIC TREATMENT   Patient Name: Amber Wilkins MRN: [id number] DOB:12/14/1990, 33 y.o., female Today's Date: 05/09/2024  END OF SESSION:  PT End of Session - 05/09/24 1032     Visit Number 2    Date for PT Re-Evaluation 11/01/24    Authorization Type [employer]    Authorization Time Period 6/12-7/11;    Authorization - Visit Number 1    Authorization - Number of Visits 4    PT Start Time 1030    PT Stop Time 1115    PT Time Calculation (min) 45 min    Activity Tolerance Patient tolerated treatment well    Behavior During Therapy WFL for tasks assessed/performed          Past Medical History:  Diagnosis Date   History of abnormal cervical Pap smear    Moderate persistent asthma without complication    followed by pcp   OAB (overactive bladder)    Seasonal allergies    SUI (stress urinary incontinence, female)    Uterovaginal prolapse, incomplete    Wears glasses    Past Surgical History:  Procedure Laterality Date   APPENDECTOMY  09/25/2001   @[facility]  by dr Melven Stable. Lynder Sanger;  open   CYSTOSCOPY N/A 01/02/2024   Procedure: Orin Birk;  Surgeon: Arma Lamp, MD;  Location: [facility];  Service: Gynecology;  Laterality: N/A;   WISDOM TOOTH EXTRACTION     XI ROBOTIC ASSISTED TOTAL HYSTERECTOMY WITH SACROCOLPOPEXY N/A 01/02/2024   Procedure: XI ROBOTIC ASSISTED TOTAL HYSTERECTOMY WITH BILATERAL SALPINGECOMY AND SACROCOLPOPEXY;  Surgeon: Arma Lamp, MD;  Location: [facility];  Service: Gynecology;  Laterality: N/A;  Total time requested is 3 hours   Patient Active Problem List   Diagnosis Date Noted   Uterovaginal prolapse, incomplete 01/02/2024   Moderate persistent asthma without complication 06/29/2022   Female bladder prolapse 06/29/2022   Obesity, Class I, BMI 30.0-34.9 (see actual BMI) 06/07/2016    PCP: Odette Benjamin, NP  REFERRING PROVIDER: Arma Lamp, MD    REFERRING DIAG: N32.81 (ICD-10-CM) - Overactive bladder  THERAPY DIAG:  Muscle weakness (generalized)  Pelvic pain  Rationale for Evaluation and Treatment: Rehabilitation  ONSET DATE: 2017  SUBJECTIVE:                                                                                                                                                                                           SUBJECTIVE STATEMENT: I have cut out the coffee and less soda and notice a difference. When I urinate I have to spread my leg and push the rest the  urine out.  Fluid intake: water , 2 cans of soda per day, coffee  PAIN:  Are you having pain? Yes NPRS scale: 1/10 Pain location: vaginal and bladder area  Pain type: light pinching Pain description: intermittent   Aggravating factors: when bladder is full Relieving factors: empty her bladder  PRECAUTIONS: None  RED FLAGS: None   WEIGHT BEARING RESTRICTIONS: No  FALLS:  Has patient fallen in last 6 months? No  OCCUPATION: receptionist and sits alot  ACTIVITY LEVEL : 1-2 times per week does yoga  PLOF: Independent  PATIENT GOALS: stop the urinary leakage, control the pelvic floor  PERTINENT HISTORY:  XI Robotic Assisted Total Hysterectomy with Bilateral Salpingecomy and Sacrocolpopexy 01/02/24 Sexual abuse: No  BOWEL MOVEMENT: no difficulty  URINATION: Pain with urination: No Fully empty bladder: Nowhen bladder is extra full feels like she has to push at the end of urination due to the sensation to urinate is still there Stream: Strong Urgency: No Frequency: average Leakage: Sneezing and hard to tell, if she has held the urine for a long time, she feels moist in the vaginal area Pads: Yes: panty liner, some days it is dry  INTERCOURSE:  Ability to have vaginal penetration Yes  Pain with intercourse: Deep Penetration, with on all fours and entrance from behind DrynessNo Climax: yes Marinoff Scale: 1/3  PREGNANCY: Vaginal  deliveries 1 Tearing No  PROLAPSE: None   OBJECTIVE:  Note: Objective measures were completed at Evaluation unless otherwise noted.  DIAGNOSTIC FINDINGS:  none  PATIENT SURVEYS:  PFIQ-7: 17 UIQ-7: 19  COGNITION: Overall cognitive status: Within functional limits for tasks assessed     SENSATION: Light touch: Appears intact   FUNCTIONAL TESTS:  Squat without difficulty and stand on one leg without difficulty   POSTURE: decreased lumbar lordosis, posterior pelvic tilt, and clenches her buttocks   LUMBARAROM/PROM: lumbar ROM is full   LOWER EXTREMITY [id number] hip ROM is full   LOWER EXTREMITY MMT: Bilateral hip strength is 5/5  PALPATION:   General: increased rib angle and difficulty with bringing lower ribs together  Pelvic Alignment: ASIS Are equal  Abdominal: tenderness in lower abdomen, palpate above the pubic bone make the sensation to urinate; restriction around the abdominal and are well healed                External Perineal Exam: redness around the vulva area; the labia minora is starting to fuse to the labia majora                             Internal Pelvic Floor: tightness on the sides of the introitus; tenderness along the bilateral levator ani, left obturator internist, sides of the urethra and left side of the urethra  Patient confirms identification and approves PT to assess internal pelvic floor and treatment Yes  No emotional/communication barriers or cognitive limitation. Patient is motivated to learn. Patient understands and agrees with treatment goals and plan. PT explains patient will be examined in standing, sitting, and lying down to see how their muscles and joints work. When they are ready, they will be asked to remove their underwear so PT can examine their perineum. The patient is also given the option of providing their own chaperone as one is not provided in our facility. The patient also has the right and is explained the right to  defer or refuse any part of the evaluation or treatment including the internal exam. With the  patient's consent, PT will use one gloved finger to gently assess the muscles of the pelvic floor, seeing how well it contracts and relaxes and if there is muscle symmetry. After, the patient will get dressed and PT and patient will discuss exam findings and plan of care. PT and patient discuss plan of care, schedule, attendance policy and HEP activities.   PELVIC MMT:   MMT eval  Vaginal 3/5 anterior and posterior; 2/5 laterally  (Blank rows = not tested)         PROLAPSE: none  TODAY'S TREATMENT:     05/09/24 Manual: Soft tissue mobilization: Manual work to the diaphragm and abdomen to improve mobility for diaphragmatic breathing Scar tissue mobilization: Scar work around the scar on her abdomen Myofascial release: Fascial release of the umbilicus, along the scars and lower abdomen to work through the restrictions and take pressure off the bladder Neuromuscular re-education: Core retraining: Transverse abdominus with tactile cues to bring in the lower abdominals and upper 15 x Open book with contraction of the upper abdominals 15 x both sides Quadruped bear plank with engagement of the abdomen 10 x Quadruped lift opposite extremities with core engaged 10 x each way Down training: Diaphragmatic  breathing with expansion of the lower abdomen to relax the pelvic floor Therapeutic activities: Functional strengthening activities: Discussed with patient on sitting on commode with using a stool to bring knees above hips and lean forward to relax the pelvic floor.  Self-care: Discussed with patient on using Boric Acid for BV and she will look into it Educated patient on bladder irritants and how they affect the bladder and increase urinary leakage                                                                                                                             DATE: 05/02/24  EVAL  Examination completed, findings reviewed, pt educated on POC, HEP, and female pelvic floor anatomy, reasoning with pelvic floor assessment internally with pt consent. Pt motivated to participate in PT and agreeable to attempt recommendations.     PATIENT EDUCATION:  05/09/24 Education details: Access Code: [email or web address], bladder irritants Person educated: Patient Education method: Explanation, Demonstration, Tactile cues, Verbal cues, and Handouts Education comprehension: verbalized understanding, returned demonstration, verbal cues required, tactile cues required, and needs further education  HOME EXERCISE PROGRAM: 05/09/24 Access Code: [email or web address] URL: [email or web address] Date: 05/09/2024 Prepared by: Marsha Skeen  Exercises - Seated Pelvic Floor Contraction  - 3 x daily - 7 x weekly - 1 sets - 10 reps - 10 sec hold - Seated Quick Flick Pelvic Floor Contractions  - 3 x daily - 7 x weekly - 1 sets - 10 reps - Supine Diaphragmatic Breathing  - 1 x daily - 7 x weekly - 1 sets - 10 reps - Seated Diaphragmatic Breathing  - 1 x daily - 7 x weekly - 1 sets - 10 reps - Hooklying Transversus Abdominis Palpation  - 1 x  daily - 3 x weekly - 1 sets - 10 reps - Sidelying Thoracic Lumbar Rotation  - 1 x daily - 3 x weekly - 2 sets - 10 reps - Quadruped Pelvic Floor Contraction with Opposite Arm and Leg Lift  - 1 x daily - 3 x weekly - 3 sets - 10 reps - Bear Plank from [employer]  - 1 x daily - 3 x weekly - 1 sets - 10 reps  ASSESSMENT:  CLINICAL IMPRESSION: Patient is a 33 y.o. female who was seen today for physical therapy  treatment for overactive bladder. Patient reports she has had urinary leakage since 2017.  She had XI Robotic Assisted Total Hysterectomy with Bilateral Salpingecomy and Sacrocolpopexy on 01/02/24. Patient was able to feel her pelvic floor relax with diaphragmatic breathing. She understands how to relax her pelvic floor to urinate.  She is able to feel the abdominals  contract. She understands how the bladder irritants can affect the bladder. Patient will benefit from skilled therapy to reduce urinary leakage, improve strength and coordination of the muscles and improve tissue mobility.   OBJECTIVE IMPAIRMENTS: decreased activity tolerance, decreased coordination, decreased strength, increased fascial restrictions, and pain.   ACTIVITY LIMITATIONS: continence and toileting  PARTICIPATION LIMITATIONS: interpersonal relationship, shopping, and community activity  PERSONAL FACTORS: Fitness and 1 comorbidity: XI Robotic Assisted Total Hysterectomy with Bilateral Salpingecomy and Sacrocolpopexy 01/02/24 are also affecting patient's functional outcome.   REHAB POTENTIAL: Excellent  CLINICAL DECISION MAKING: Stable/uncomplicated  EVALUATION COMPLEXITY: Low   GOALS: Goals reviewed with patient? Yes  SHORT TERM GOALS: Target date: 05/30/24  Patient independent with initial HEP for pelvic floor strength to reduce urine.  Baseline: not educated yet Goal status: INITIAL   LONG TERM GOALS: Target date: 11/01/24  Patient independent with core and pelvic floor advanced HEP to further reduce urinary leakage.  Baseline: not educated yet Goal status: INITIAL  2.  Patient is able to sneeze without leaking urine due to strength is 4/5 with a circular contraction.  Baseline: leaks with a sneeze, pelvic floor strength laterally is 2/5 and anterior and posterior is 3/5.  Goal status: INITIAL  3.  Patient is able to go through the day without wearing a pad due to the reduction of urinary leakage without her realizing but had an odor.  Baseline: leaks urine without feeling it and has an odor Goal status: INITIAL  4.  UIQ-7 score is </= 10 due to reduction of urinary leakage and frustration of it happening when in public.  Baseline:UIQ-7: 19  Goal status: INITIAL  5.  Patients lower abdominal pain decreased none when her bladder fills due to improve tissue  mobility.  Baseline: pain level 1/10 Goal status: INITIAL  PLAN:  PT FREQUENCY: 1-2x/week  PT DURATION: 6 months  PLANNED INTERVENTIONS: 97110-Therapeutic exercises, 97530- Therapeutic activity, 97112- Neuromuscular re-education, 97535- Self Care, [id number]- Manual therapy, Patient/Family education, Joint mobilization, Spinal mobilization, Cryotherapy, Moist heat, and Biofeedback  PLAN FOR NEXT SESSION:  work on circular contraction, upper abdominal contraction  Marsha Skeen, PT 05/09/24 11:30 AM

## 2024-05-14 ENCOUNTER — Encounter: Payer: Self-pay | Admitting: Physical Therapy

## 2024-05-14 DIAGNOSIS — M6281 Muscle weakness (generalized): Secondary | ICD-10-CM

## 2024-05-14 DIAGNOSIS — R102 Pelvic and perineal pain: Secondary | ICD-10-CM

## 2024-05-14 NOTE — Therapy (Signed)
 OUTPATIENT PHYSICAL THERAPY FEMALE PELVIC TREATMENT   Patient Name: Amber Wilkins MRN: [id number] DOB:05-19-1991, 33 y.o., female Today's Date: 05/14/2024  END OF SESSION:  PT End of Session - 05/14/24 0826     Visit Number 3    Date for PT Re-Evaluation 11/01/24    Authorization Type [employer]    Authorization Time Period 6/12-7/11;    Authorization - Visit Number 2    Authorization - Number of Visits 5    PT Start Time 0825    PT Stop Time 0915    PT Time Calculation (min) 50 min    Activity Tolerance Patient tolerated treatment well    Behavior During Therapy WFL for tasks assessed/performed          Past Medical History:  Diagnosis Date   History of abnormal cervical Pap smear    Moderate persistent asthma without complication    followed by pcp   OAB (overactive bladder)    Seasonal allergies    SUI (stress urinary incontinence, female)    Uterovaginal prolapse, incomplete    Wears glasses    Past Surgical History:  Procedure Laterality Date   APPENDECTOMY  09/25/2001   @[facility]  by dr [email or web address]. [email or web address];  open   CYSTOSCOPY N/A 01/02/2024   Procedure: PHYLLIS;  Surgeon: Marilynne Rosaline LOISE, MD;  Location: [facility];  Service: Gynecology;  Laterality: N/A;   WISDOM TOOTH EXTRACTION     XI ROBOTIC ASSISTED TOTAL HYSTERECTOMY WITH SACROCOLPOPEXY N/A 01/02/2024   Procedure: XI ROBOTIC ASSISTED TOTAL HYSTERECTOMY WITH BILATERAL SALPINGECOMY AND SACROCOLPOPEXY;  Surgeon: Marilynne Rosaline LOISE, MD;  Location: [facility];  Service: Gynecology;  Laterality: N/A;  Total time requested is 3 hours   Patient Active Problem List   Diagnosis Date Noted   Uterovaginal prolapse, incomplete 01/02/2024   Moderate persistent asthma without complication 06/29/2022   Female bladder prolapse 06/29/2022   Obesity, Class I, BMI 30.0-34.9 (see actual BMI) 06/07/2016    PCP: Nedra Tinnie LABOR, NP  REFERRING PROVIDER: Marilynne Rosaline LOISE, MD    REFERRING DIAG: N32.81 (ICD-10-CM) - Overactive bladder  THERAPY DIAG:  Muscle weakness (generalized)  Pelvic pain  Rationale for Evaluation and Treatment: Rehabilitation  ONSET DATE: 2017  SUBJECTIVE:                                                                                                                                                                                           SUBJECTIVE STATEMENT: I have less leakage when I do the exercises. Drinking only water  helps with leakage. The weight of the stomach taken off my bladder helps. Breath work  helps with getting the urine out. I get a pinch form the muscles intermittent. I have less pressure and decreased by 50%.  Fluid intake: water , 2 cans of soda per day, coffee  PAIN:  Are you having pain? Yes NPRS scale: 1/10 Pain location: vaginal and bladder area  Pain type: light pinching Pain description: intermittent   Aggravating factors: when bladder is full Relieving factors: empty her bladder  PRECAUTIONS: None  RED FLAGS: None   WEIGHT BEARING RESTRICTIONS: No  FALLS:  Has patient fallen in last 6 months? No  OCCUPATION: receptionist and sits alot  ACTIVITY LEVEL : 1-2 times per week does yoga  PLOF: Independent  PATIENT GOALS: stop the urinary leakage, control the pelvic floor  PERTINENT HISTORY:  XI Robotic Assisted Total Hysterectomy with Bilateral Salpingecomy and Sacrocolpopexy 01/02/24 Sexual abuse: No  BOWEL MOVEMENT: no difficulty  URINATION: Pain with urination: No Fully empty bladder: Nowhen bladder is extra full feels like she has to push at the end of urination due to the sensation to urinate is still there Stream: Strong Urgency: No Frequency: average Leakage: Sneezing and hard to tell, if she has held the urine for a long time, she feels moist in the vaginal area Pads: Yes: panty liner, some days it is dry  INTERCOURSE:  Ability to have vaginal penetration Yes  Pain with  intercourse: Deep Penetration, with on all fours and entrance from behind DrynessNo Climax: yes Marinoff Scale: 1/3  PREGNANCY: Vaginal deliveries 1 Tearing No  PROLAPSE: None   OBJECTIVE:  Note: Objective measures were completed at Evaluation unless otherwise noted.  DIAGNOSTIC FINDINGS:  none  PATIENT SURVEYS:  PFIQ-7: 17 UIQ-7: 19  COGNITION: Overall cognitive status: Within functional limits for tasks assessed     SENSATION: Light touch: Appears intact   FUNCTIONAL TESTS:  Squat without difficulty and stand on one leg without difficulty   POSTURE: decreased lumbar lordosis, posterior pelvic tilt, and clenches her buttocks   LUMBARAROM/PROM: lumbar ROM is full   LOWER EXTREMITY MNF:apojuzmjo hip ROM is full   LOWER EXTREMITY MMT: Bilateral hip strength is 5/5  PALPATION:   General: increased rib angle and difficulty with bringing lower ribs together  Pelvic Alignment: ASIS Are equal  Abdominal: tenderness in lower abdomen, palpate above the pubic bone make the sensation to urinate; restriction around the abdominal and are well healed                External Perineal Exam: redness around the vulva area; the labia minora is starting to fuse to the labia majora                             Internal Pelvic Floor: tightness on the sides of the introitus; tenderness along the bilateral levator ani, left obturator internist, sides of the urethra and left side of the urethra  Patient confirms identification and approves PT to assess internal pelvic floor and treatment Yes  No emotional/communication barriers or cognitive limitation. Patient is motivated to learn. Patient understands and agrees with treatment goals and plan. PT explains patient will be examined in standing, sitting, and lying down to see how their muscles and joints work. When they are ready, they will be asked to remove their underwear so PT can examine their perineum. The patient is also given the  option of providing their own chaperone as one is not provided in our facility. The patient also has the right and is  explained the right to defer or refuse any part of the evaluation or treatment including the internal exam. With the patient's consent, PT will use one gloved finger to gently assess the muscles of the pelvic floor, seeing how well it contracts and relaxes and if there is muscle symmetry. After, the patient will get dressed and PT and patient will discuss exam findings and plan of care. PT and patient discuss plan of care, schedule, attendance policy and HEP activities.   PELVIC MMT:   MMT eval 05/14/24  Vaginal 3/5 anterior and posterior; 2/5 laterally 4/5 after manual work  (Blank rows = not tested)         PROLAPSE: none  TODAY'S TREATMENT:     05/14/24 Manual: Myofascial release: Urogenital diaphragm release going through the layers with left tighter than the right Internal pelvic floor techniques: No emotional/communication barriers or cognitive limitation. Patient is motivated to learn. Patient understands and agrees with treatment goals and plan. PT explains patient will be examined in standing, sitting, and lying down to see how their muscles and joints work. When they are ready, they will be asked to remove their underwear so PT can examine their perineum. The patient is also given the option of providing their own chaperone as one is not provided in our facility. The patient also has the right and is explained the right to defer or refuse any part of the evaluation or treatment including the internal exam. With the patient's consent, PT will use one gloved finger to gently assess the muscles of the pelvic floor, seeing how well it contracts and relaxes and if there is muscle symmetry. After, the patient will get dressed and PT and patient will discuss exam findings and plan of care. PT and patient discuss plan of care, schedule, attendance policy and HEP activities.   Therapist gloved finger working on the perineal body and levator ani with one finger internal and other external going through the layers Therapist gloved finger working on fascial release of the urethra and sides of the bladder with other hand on the suprapubic are to release Therapist gloved finger working on the obturator internist and iliococcygeus with hip movement to release     05/09/24 Manual: Soft tissue mobilization: Manual work to the diaphragm and abdomen to improve mobility for diaphragmatic breathing Scar tissue mobilization: Scar work around the scar on her abdomen Myofascial release: Fascial release of the umbilicus, along the scars and lower abdomen to work through the restrictions and take pressure off the bladder Neuromuscular re-education: Core retraining: Transverse abdominus with tactile cues to bring in the lower abdominals and upper 15 x Open book with contraction of the upper abdominals 15 x both sides Quadruped bear plank with engagement of the abdomen 10 x Quadruped lift opposite extremities with core engaged 10 x each way Down training: Diaphragmatic  breathing with expansion of the lower abdomen to relax the pelvic floor Therapeutic activities: Functional strengthening activities: Discussed with patient on sitting on commode with using a stool to bring knees above hips and lean forward to relax the pelvic floor.  Self-care: Discussed with patient on using Boric Acid for BV and she will look into it Educated patient on bladder irritants and how they affect the bladder and increase urinary leakage  DATE: 05/02/24  EVAL Examination completed, findings reviewed, pt educated on POC, HEP, and female pelvic floor anatomy, reasoning with pelvic floor assessment internally with pt consent. Pt motivated to participate in PT and agreeable to attempt  recommendations.     PATIENT EDUCATION:  05/09/24 Education details: Access Code: [email or web address], bladder irritants Person educated: Patient Education method: Explanation, Demonstration, Tactile cues, Verbal cues, and Handouts Education comprehension: verbalized understanding, returned demonstration, verbal cues required, tactile cues required, and needs further education  HOME EXERCISE PROGRAM: 05/09/24 Access Code: [email or web address] URL: [email or web address] Date: 05/09/2024 Prepared by: Channing Pereyra  Exercises - Seated Pelvic Floor Contraction  - 3 x daily - 7 x weekly - 1 sets - 10 reps - 10 sec hold - Seated Quick Flick Pelvic Floor Contractions  - 3 x daily - 7 x weekly - 1 sets - 10 reps - Supine Diaphragmatic Breathing  - 1 x daily - 7 x weekly - 1 sets - 10 reps - Seated Diaphragmatic Breathing  - 1 x daily - 7 x weekly - 1 sets - 10 reps - Hooklying Transversus Abdominis Palpation  - 1 x daily - 3 x weekly - 1 sets - 10 reps - Sidelying Thoracic Lumbar Rotation  - 1 x daily - 3 x weekly - 2 sets - 10 reps - Quadruped Pelvic Floor Contraction with Opposite Arm and Leg Lift  - 1 x daily - 3 x weekly - 3 sets - 10 reps - Bear Plank from Quadruped  - 1 x daily - 3 x weekly - 1 sets - 10 reps  ASSESSMENT:  CLINICAL IMPRESSION: Patient is a 33 y.o. female who was seen today for physical therapy  treatment for overactive bladder. Patient reports she has had urinary leakage since 2017.  She had XI Robotic Assisted Total Hysterectomy with Bilateral Salpingecomy and Sacrocolpopexy on 01/02/24. Pelvic floor strength is 4/5 and patient can feel the circular contraction. Pelvic floor pressure reduced by 50%. She had areas on the urethra that the therapist would release and patient reported that is where she feels the burning when urinating.  Patient will benefit from skilled therapy to reduce urinary leakage, improve strength and coordination of the muscles and improve tissue mobility.    OBJECTIVE IMPAIRMENTS: decreased activity tolerance, decreased coordination, decreased strength, increased fascial restrictions, and pain.   ACTIVITY LIMITATIONS: continence and toileting  PARTICIPATION LIMITATIONS: interpersonal relationship, shopping, and community activity  PERSONAL FACTORS: Fitness and 1 comorbidity: XI Robotic Assisted Total Hysterectomy with Bilateral Salpingecomy and Sacrocolpopexy 01/02/24 are also affecting patient's functional outcome.   REHAB POTENTIAL: Excellent  CLINICAL DECISION MAKING: Stable/uncomplicated  EVALUATION COMPLEXITY: Low   GOALS: Goals reviewed with patient? Yes  SHORT TERM GOALS: Target date: 05/30/24  Patient independent with initial HEP for pelvic floor strength to reduce urine.  Baseline: not educated yet Goal status: Met 05/14/24   LONG TERM GOALS: Target date: 11/01/24  Patient independent with core and pelvic floor advanced HEP to further reduce urinary leakage.  Baseline: not educated yet Goal status: INITIAL  2.  Patient is able to sneeze without leaking urine due to strength is 4/5 with a circular contraction.  Baseline: leaks with a sneeze, pelvic floor strength laterally is 2/5 and anterior and posterior is 3/5.  Goal status: INITIAL  3.  Patient is able to go through the day without wearing a pad due to the reduction of urinary leakage without her realizing but had an odor.  Baseline: leaks urine without feeling it and has  an odor Goal status: INITIAL  4.  UIQ-7 score is </= 10 due to reduction of urinary leakage and frustration of it happening when in public.  Baseline:UIQ-7: 19  Goal status: INITIAL  5.  Patients lower abdominal pain decreased none when her bladder fills due to improve tissue mobility.  Baseline: pain level 1/10 Goal status: INITIAL  PLAN:  PT FREQUENCY: 1-2x/week  PT DURATION: 6 months  PLANNED INTERVENTIONS: 97110-Therapeutic exercises, 97530- Therapeutic activity, 97112- Neuromuscular  re-education, 97535- Self Care, [id number]- Manual therapy, Patient/Family education, Joint mobilization, Spinal mobilization, Cryotherapy, Moist heat, and Biofeedback  PLAN FOR NEXT SESSION:   upper abdominal contraction; posterior hip and SI joint release  Channing Pereyra, PT 05/14/24 9:24 AM

## 2024-05-23 ENCOUNTER — Encounter: Payer: Self-pay | Admitting: Physical Therapy

## 2024-06-04 ENCOUNTER — Encounter: Payer: Self-pay | Attending: Obstetrics and Gynecology | Admitting: Physical Therapy

## 2024-06-04 ENCOUNTER — Encounter: Payer: Self-pay | Admitting: Physical Therapy

## 2024-06-04 DIAGNOSIS — M6281 Muscle weakness (generalized): Secondary | ICD-10-CM | POA: Insufficient documentation

## 2024-06-04 DIAGNOSIS — R102 Pelvic and perineal pain: Secondary | ICD-10-CM | POA: Diagnosis present

## 2024-06-04 NOTE — Therapy (Addendum)
 OUTPATIENT PHYSICAL THERAPY FEMALE PELVIC TREATMENT   Patient Name: Amber Wilkins MRN: [id number] DOB:10-31-1991, 33 y.o., female Today's Date: 06/04/2024  END OF SESSION:  PT End of Session - 06/04/24 0828     Visit Number 4    Date for PT Re-Evaluation 11/01/24    Authorization Type [employer]    Authorization Time Period 7/12-8/10    Authorization - Visit Number 1    Authorization - Number of Visits 4    PT Start Time 0829    PT Stop Time 0915    PT Time Calculation (min) 46 min    Activity Tolerance Patient tolerated treatment well    Behavior During Therapy WFL for tasks assessed/performed          Past Medical History:  Diagnosis Date   History of abnormal cervical Pap smear    Moderate persistent asthma without complication    followed by pcp   OAB (overactive bladder)    Seasonal allergies    SUI (stress urinary incontinence, female)    Uterovaginal prolapse, incomplete    Wears glasses    Past Surgical History:  Procedure Laterality Date   APPENDECTOMY  09/25/2001   @[facility]  by dr [email or web address]. claudius;  open   CYSTOSCOPY N/A 01/02/2024   Procedure: PHYLLIS;  Surgeon: Marilynne Rosaline LOISE, MD;  Location: [facility];  Service: Gynecology;  Laterality: N/A;   WISDOM TOOTH EXTRACTION     XI ROBOTIC ASSISTED TOTAL HYSTERECTOMY WITH SACROCOLPOPEXY N/A 01/02/2024   Procedure: XI ROBOTIC ASSISTED TOTAL HYSTERECTOMY WITH BILATERAL SALPINGECOMY AND SACROCOLPOPEXY;  Surgeon: Marilynne Rosaline LOISE, MD;  Location: [facility];  Service: Gynecology;  Laterality: N/A;  Total time requested is 3 hours   Patient Active Problem List   Diagnosis Date Noted   Uterovaginal prolapse, incomplete 01/02/2024   Moderate persistent asthma without complication 06/29/2022   Female bladder prolapse 06/29/2022   Obesity, Class I, BMI 30.0-34.9 (see actual BMI) 06/07/2016    PCP: Nedra Tinnie LABOR, NP  REFERRING PROVIDER: Marilynne Rosaline LOISE, MD    REFERRING DIAG: N32.81 (ICD-10-CM) - Overactive bladder  THERAPY DIAG:  Muscle weakness (generalized)  Pelvic pain  Rationale for Evaluation and Treatment: Rehabilitation  ONSET DATE: 2017  SUBJECTIVE:                                                                                                                                                                                           SUBJECTIVE STATEMENT: I am more mindful on how I am sitting. The day I did not do the exericses and drank soda and had increased leaakge. Some days I  am not leaking. I have been practicing the relaxation I have less leakage. .  Fluid intake: water , 2 cans of soda per day, coffee  PAIN:  Are you having pain? Yes NPRS scale: 1/10 Pain location: vaginal and bladder area  Pain type: light pinching Pain description: intermittent   Aggravating factors: when bladder is full Relieving factors: empty her bladder  PRECAUTIONS: None  RED FLAGS: None   WEIGHT BEARING RESTRICTIONS: No  FALLS:  Has patient fallen in last 6 months? No  OCCUPATION: receptionist and sits alot  ACTIVITY LEVEL : 1-2 times per week does yoga  PLOF: Independent  PATIENT GOALS: stop the urinary leakage, control the pelvic floor  PERTINENT HISTORY:  XI Robotic Assisted Total Hysterectomy with Bilateral Salpingecomy and Sacrocolpopexy 01/02/24 Sexual abuse: No  BOWEL MOVEMENT: no difficulty  URINATION: Pain with urination: No Fully empty bladder: Nowhen bladder is extra full feels like she has to push at the end of urination due to the sensation to urinate is still there Stream: Strong Urgency: No Frequency: average Leakage: Sneezing and hard to tell, if she has held the urine for a long time, she feels moist in the vaginal area Pads: Yes: panty liner, some days it is dry  INTERCOURSE:  Ability to have vaginal penetration Yes  Pain with intercourse: Deep Penetration, with on all fours and entrance from  behind DrynessNo Climax: yes Marinoff Scale: 1/3  PREGNANCY: Vaginal deliveries 1 Tearing No  PROLAPSE: None   OBJECTIVE:  Note: Objective measures were completed at Evaluation unless otherwise noted.  DIAGNOSTIC FINDINGS:  none  PATIENT SURVEYS:  PFIQ-7: 17 UIQ-7: 19  COGNITION: Overall cognitive status: Within functional limits for tasks assessed     SENSATION: Light touch: Appears intact   FUNCTIONAL TESTS:  Squat without difficulty and stand on one leg without difficulty   POSTURE: decreased lumbar lordosis, posterior pelvic tilt, and clenches her buttocks   LUMBARAROM/PROM: lumbar ROM is full   LOWER EXTREMITY MNF:apojuzmjo hip ROM is full   LOWER EXTREMITY MMT: Bilateral hip strength is 5/5  PALPATION:   General: increased rib angle and difficulty with bringing lower ribs together  Pelvic Alignment: ASIS Are equal  Abdominal: tenderness in lower abdomen, palpate above the pubic bone make the sensation to urinate; restriction around the abdominal and are well healed                External Perineal Exam: redness around the vulva area; the labia minora is starting to fuse to the labia majora                             Internal Pelvic Floor: tightness on the sides of the introitus; tenderness along the bilateral levator ani, left obturator internist, sides of the urethra and left side of the urethra  Patient confirms identification and approves PT to assess internal pelvic floor and treatment Yes  No emotional/communication barriers or cognitive limitation. Patient is motivated to learn. Patient understands and agrees with treatment goals and plan. PT explains patient will be examined in standing, sitting, and lying down to see how their muscles and joints work. When they are ready, they will be asked to remove their underwear so PT can examine their perineum. The patient is also given the option of providing their own chaperone as one is not provided in  our facility. The patient also has the right and is explained the right to defer or refuse any  part of the evaluation or treatment including the internal exam. With the patient's consent, PT will use one gloved finger to gently assess the muscles of the pelvic floor, seeing how well it contracts and relaxes and if there is muscle symmetry. After, the patient will get dressed and PT and patient will discuss exam findings and plan of care. PT and patient discuss plan of care, schedule, attendance policy and HEP activities.   PELVIC MMT:   MMT eval 05/14/24  Vaginal 3/5 anterior and posterior; 2/5 laterally 4/5 after manual work  (Blank rows = not tested)         PROLAPSE: none  TODAY'S TREATMENT:     06/04/24 Manual: Mobilization: PA and rotational mobilization to T10 to L5 grade 3 Right hip posterior glide and distraction grade 3 in supine Neuromuscular re-education: Down training: Foam rolling the piriformis to relax the muscle on bilateral sides. Foam rolling the quadriceps to relax the muscles on bilateral sides Foam rolling the ITB to relax the muscles on bilateral sides Foam rolling hamstring to relax the muscles on bilateral sides Exercises: Stretches/mobility: Prone press up 3 times Lay with foam roll along the spine to elongate the spine Supine hip stretches with contraction relax for quadriceps, hamstring, posterior hip, piriformis  Strengthening: Quadruped with lower leg on the foam roll and move forward and back 15 x Quadruped with arms on the foam roll and lift alternate extremity 10 x each side Quadruped with lower leg on the foam roll and lift alternate extremity 10 x each side Bridge with feet on foam roll and lift arms then move legs back and forth 10 x     05/14/24 Manual: Myofascial release: Urogenital diaphragm release going through the layers with left tighter than the right Internal pelvic floor techniques: No emotional/communication barriers or cognitive  limitation. Patient is motivated to learn. Patient understands and agrees with treatment goals and plan. PT explains patient will be examined in standing, sitting, and lying down to see how their muscles and joints work. When they are ready, they will be asked to remove their underwear so PT can examine their perineum. The patient is also given the option of providing their own chaperone as one is not provided in our facility. The patient also has the right and is explained the right to defer or refuse any part of the evaluation or treatment including the internal exam. With the patient's consent, PT will use one gloved finger to gently assess the muscles of the pelvic floor, seeing how well it contracts and relaxes and if there is muscle symmetry. After, the patient will get dressed and PT and patient will discuss exam findings and plan of care. PT and patient discuss plan of care, schedule, attendance policy and HEP activities.  Therapist gloved finger working on the perineal body and levator ani with one finger internal and other external going through the layers Therapist gloved finger working on fascial release of the urethra and sides of the bladder with other hand on the suprapubic are to release Therapist gloved finger working on the obturator internist and iliococcygeus with hip movement to release     05/09/24 Manual: Soft tissue mobilization: Manual work to the diaphragm and abdomen to improve mobility for diaphragmatic breathing Scar tissue mobilization: Scar work around the scar on her abdomen Myofascial release: Fascial release of the umbilicus, along the scars and lower abdomen to work through the restrictions and take pressure off the bladder Neuromuscular re-education: Core retraining: Transverse abdominus  with tactile cues to bring in the lower abdominals and upper 15 x Open book with contraction of the upper abdominals 15 x both sides Quadruped bear plank with engagement of the  abdomen 10 x Quadruped lift opposite extremities with core engaged 10 x each way Down training: Diaphragmatic  breathing with expansion of the lower abdomen to relax the pelvic floor Therapeutic activities: Functional strengthening activities: Discussed with patient on sitting on commode with using a stool to bring knees above hips and lean forward to relax the pelvic floor.  Self-care: Discussed with patient on using Boric Acid for BV and she will look into it Educated patient on bladder irritants and how they affect the bladder and increase urinary leakage      PATIENT EDUCATION:  06/04/24 Education details: Access Code: [email or web address], bladder irritants Person educated: Patient Education method: Explanation, Demonstration, Tactile cues, Verbal cues, and Handouts Education comprehension: verbalized understanding, returned demonstration, verbal cues required, tactile cues required, and needs further education  HOME EXERCISE PROGRAM: 06/04/24 Access Code: [email or web address] URL: [email or web address] Date: 06/04/2024 Prepared by: Channing Pereyra  Exercises - Seated Pelvic Floor Contraction  - 3 x daily - 7 x weekly - 1 sets - 10 reps - 10 sec hold - Seated Quick Flick Pelvic Floor Contractions  - 3 x daily - 7 x weekly - 1 sets - 10 reps - Sidelying Thoracic Lumbar Rotation  - 1 x daily - 3 x weekly - 2 sets - 10 reps - Bear Plank from [employer]  - 1 x daily - 3 x weekly - 1 sets - 10 reps - Quadriceps Mobilization with Foam Roll  - 1 x daily - 2 x weekly - 1 sets - 10 reps - Piriformis Mobilization on Foam Roll  - 1 x daily - 2 x weekly - 1 sets - 10 reps - Sidelying IT Band Foam Roll Mobilization  - 1 x daily - 2 x weekly - 1 sets - 10 reps - Hamstring Mobilization on Foam Roll  - 1 x daily - 2 x weekly - 1 sets - 10 reps - Bird Dog on Foam Roll  - 1 x daily - 3 x weekly - 1 sets - 10 reps - Bridge on Foam Roll - Arms on Floor  - 1 x daily - 3 x weekly - 1 sets - 10 reps - Bridge on  Foam Roll - Arms Raised  - 1 x daily - 3 x weekly - 1 sets - 10 reps - Supine Chest Stretch on Foam Roll  - 1 x daily - 2 x weekly - 1 sets - 10 reps - DNS Low Oblique Sitting Reach with Leg up  - 1 x daily - 2 x weekly - 1 sets - 10 reps - Plank with Oblique Crunch  - 1 x daily - 2 x weekly - 1 sets - 10 reps  ASSESSMENT:  CLINICAL IMPRESSION: Patient is a 33 y.o. female who was seen today for physical therapy  treatment for overactive bladder. Patient reports she has had urinary leakage since 2017.  She had XI Robotic Assisted Total Hysterectomy with Bilateral Salpingecomy and Sacrocolpopexy on 01/02/24. Patient will have the spasm in the vaginal area and bladder when she drinks caffeine  and tenses the pelvic floor. .Today focuses on progressing her core and pelvic floor exercises and stretching the hips and back. She is able to engage the lower abdominals correctly and perform diaphragmatic breathing to lengthen the pelvic floor. She is aware of what bladder  irritants are triggers with her bladder.   Patient will benefit from skilled therapy to reduce urinary leakage, improve strength and coordination of the muscles and improve tissue mobility.   OBJECTIVE IMPAIRMENTS: decreased activity tolerance, decreased coordination, decreased strength, increased fascial restrictions, and pain.   ACTIVITY LIMITATIONS: continence and toileting  PARTICIPATION LIMITATIONS: interpersonal relationship, shopping, and community activity  PERSONAL FACTORS: Fitness and 1 comorbidity: XI Robotic Assisted Total Hysterectomy with Bilateral Salpingecomy and Sacrocolpopexy 01/02/24 are also affecting patient's functional outcome.   REHAB POTENTIAL: Excellent  CLINICAL DECISION MAKING: Stable/uncomplicated  EVALUATION COMPLEXITY: Low   GOALS: Goals reviewed with patient? Yes  SHORT TERM GOALS: Target date: 05/30/24  Patient independent with initial HEP for pelvic floor strength to reduce urine.  Baseline: not  educated yet Goal status: Met 05/14/24   LONG TERM GOALS: Target date: 11/01/24  Patient independent with core and pelvic floor advanced HEP to further reduce urinary leakage.  Baseline: not educated yet Goal status: INITIAL  2.  Patient is able to sneeze without leaking urine due to strength is 4/5 with a circular contraction.  Baseline: leaks with a sneeze, pelvic floor strength laterally is 2/5 and anterior and posterior is 3/5.  Goal status: INITIAL  3.  Patient is able to go through the day without wearing a pad due to the reduction of urinary leakage without her realizing but had an odor.  Baseline: leaks urine without feeling it and has an odor Goal status: INITIAL  4.  UIQ-7 score is </= 10 due to reduction of urinary leakage and frustration of it happening when in public.  Baseline:UIQ-7: 19  Goal status: INITIAL  5.  Patients lower abdominal pain decreased none when her bladder fills due to improve tissue mobility.  Baseline: pain level 1/10 Goal status: INITIAL  PLAN:  PT FREQUENCY: 1-2x/week  PT DURATION: 6 months  PLANNED INTERVENTIONS: 97110-Therapeutic exercises, 97530- Therapeutic activity, 97112- Neuromuscular re-education, 97535- Self Care, [id number]- Manual therapy, Patient/Family education, Joint mobilization, Spinal mobilization, Cryotherapy, Moist heat, and Biofeedback  PLAN FOR NEXT SESSION:   work on rib out flare exercises, see if pain with bladder filling,   Channing Pereyra, PT 06/04/24 9:23 AM  PHYSICAL THERAPY DISCHARGE SUMMARY  Visits from Start of Care: 4  Current functional level related to goals / functional outcomes: See above. Patient did not return after last visit  on 06/04/24.    Remaining deficits: See above    Education / Equipment: HEP   Patient agrees to discharge. Patient goals were not met. Patient is being discharged due to not returning since the last visit. Thank you for the referral.   Channing Pereyra, PT 11/04/2024 10:55  AM

## 2024-06-25 ENCOUNTER — Encounter: Payer: Self-pay | Admitting: Physical Therapy

## 2024-11-25 ENCOUNTER — Ambulatory Visit (INDEPENDENT_AMBULATORY_CARE_PROVIDER_SITE_OTHER): Admitting: Nurse Practitioner

## 2024-11-25 ENCOUNTER — Encounter: Payer: Self-pay | Admitting: Nurse Practitioner

## 2024-11-25 ENCOUNTER — Ambulatory Visit: Payer: Self-pay | Admitting: Nurse Practitioner

## 2024-11-25 VITALS — BP 126/88 | HR 87 | Temp 97.1°F | Ht 62.0 in | Wt 213.0 lb

## 2024-11-25 DIAGNOSIS — R7989 Other specified abnormal findings of blood chemistry: Secondary | ICD-10-CM

## 2024-11-25 DIAGNOSIS — Z23 Encounter for immunization: Secondary | ICD-10-CM | POA: Diagnosis not present

## 2024-11-25 DIAGNOSIS — Z136 Encounter for screening for cardiovascular disorders: Secondary | ICD-10-CM | POA: Diagnosis not present

## 2024-11-25 DIAGNOSIS — H6993 Unspecified Eustachian tube disorder, bilateral: Secondary | ICD-10-CM | POA: Diagnosis not present

## 2024-11-25 DIAGNOSIS — Z0000 Encounter for general adult medical examination without abnormal findings: Secondary | ICD-10-CM | POA: Insufficient documentation

## 2024-11-25 DIAGNOSIS — J4540 Moderate persistent asthma, uncomplicated: Secondary | ICD-10-CM

## 2024-11-25 DIAGNOSIS — E669 Obesity, unspecified: Secondary | ICD-10-CM | POA: Insufficient documentation

## 2024-11-25 LAB — COMPREHENSIVE METABOLIC PANEL WITH GFR
ALT: 85 U/L — ABNORMAL HIGH (ref 3–35)
AST: 54 U/L — ABNORMAL HIGH (ref 5–37)
Albumin: 4.6 g/dL (ref 3.5–5.2)
Alkaline Phosphatase: 79 U/L (ref 39–117)
BUN: 10 mg/dL (ref 6–23)
CO2: 28 meq/L (ref 19–32)
Calcium: 9 mg/dL (ref 8.4–10.5)
Chloride: 101 meq/L (ref 96–112)
Creatinine, Ser: 0.85 mg/dL (ref 0.40–1.20)
GFR: 90.89 mL/min
Glucose, Bld: 93 mg/dL (ref 70–99)
Potassium: 3.8 meq/L (ref 3.5–5.1)
Sodium: 136 meq/L (ref 135–145)
Total Bilirubin: 0.9 mg/dL (ref 0.2–1.2)
Total Protein: 8 g/dL (ref 6.0–8.3)

## 2024-11-25 LAB — LIPID PANEL
Cholesterol: 169 mg/dL (ref 28–200)
HDL: 38 mg/dL — ABNORMAL LOW
LDL Cholesterol: 117 mg/dL — ABNORMAL HIGH (ref 10–99)
NonHDL: 131.1
Total CHOL/HDL Ratio: 4
Triglycerides: 69 mg/dL (ref 10.0–149.0)
VLDL: 13.8 mg/dL (ref 0.0–40.0)

## 2024-11-25 LAB — CBC WITH DIFFERENTIAL/PLATELET
Basophils Absolute: 0 K/uL (ref 0.0–0.1)
Basophils Relative: 0.8 % (ref 0.0–3.0)
Eosinophils Absolute: 0.1 K/uL (ref 0.0–0.7)
Eosinophils Relative: 1.7 % (ref 0.0–5.0)
HCT: 42.7 % (ref 36.0–46.0)
Hemoglobin: 14.8 g/dL (ref 12.0–15.0)
Lymphocytes Relative: 26.1 % (ref 12.0–46.0)
Lymphs Abs: 1.3 K/uL (ref 0.7–4.0)
MCHC: 34.6 g/dL (ref 30.0–36.0)
MCV: 89.6 fl (ref 78.0–100.0)
Monocytes Absolute: 0.8 K/uL (ref 0.1–1.0)
Monocytes Relative: 16.9 % — ABNORMAL HIGH (ref 3.0–12.0)
Neutro Abs: 2.7 K/uL (ref 1.4–7.7)
Neutrophils Relative %: 54.5 % (ref 43.0–77.0)
Platelets: 221 K/uL (ref 150.0–400.0)
RBC: 4.76 Mil/uL (ref 3.87–5.11)
RDW: 12.2 % (ref 11.5–15.5)
WBC: 4.9 K/uL (ref 4.0–10.5)

## 2024-11-25 MED ORDER — FLUTICASONE PROPIONATE 50 MCG/ACT NA SUSP: 2.0000 | Freq: Every day | NASAL | 6 refills | Status: AC

## 2024-11-25 NOTE — Assessment & Plan Note (Signed)
 Health maintenance reviewed and updated. Discussed nutrition, exercise. Check CMP, CBC today. Follow-up 1 year.

## 2024-11-25 NOTE — Patient Instructions (Signed)
 It was great to see you!  We are checking your labs today and will let you know the results via mychart/phone.   Start flonase  nasal spray daily for 2 weeks to help you ears. Let me know if they still feel clogged after 2 weeks.   Let's follow-up in 1 year, sooner if you have concerns.  If a referral was placed today, you will be contacted for an appointment. Please note that routine referrals can sometimes take up to 3-4 weeks to process. Please call our office if you haven't heard anything after this time frame.  Take care,  Tinnie Harada, NP

## 2024-11-25 NOTE — Progress Notes (Signed)
 "  BP 126/88 (BP Location: Left Arm, Patient Position: Sitting, Cuff Size: Normal)   Pulse 87   Temp (!) 97.1 F (36.2 C)   Ht 5' 2 (1.575 m)   Wt 213 lb (96.6 kg)   LMP 11/30/2023   SpO2 97%   BMI 38.96 kg/m    Subjective:    Patient ID: Amber Wilkins, female    DOB: Sep 18, 1991, 34 y.o.   MRN: [id number]  CC: Chief Complaint  Patient presents with   Annual Exam    With fasting labs, concerns with ears feeling clogged    HPI: Amber Wilkins is a 34 y.o. female presenting on 11/25/2024 for comprehensive medical examination. Current medical complaints include:ears feel clogged  She notes that she was sick with the crud 3-4 weeks ago. Her ears were popping and is now feeling clogged. She had some pain a few days ago. She denies decreased hearing. She has some mild nasal congestion and takes zyrtec  daily.   Depression and Anxiety Screen done today and results listed below:     11/25/2024   11:28 AM 11/21/2023    5:31 PM 02/13/2023   11:08 AM 08/16/2022   11:17 AM 06/29/2022    2:47 PM  Depression screen PHQ 2/9  Decreased Interest 0 0 0 0 0  Down, Depressed, Hopeless 0 0 0 0 0  PHQ - 2 Score 0 0 0 0 0  Altered sleeping 0 0 0    Tired, decreased energy 0 0 0    Change in appetite 0 0 0    Feeling bad or failure about yourself  0 0 0    Trouble concentrating 0 0 0    Moving slowly or fidgety/restless 0 0 0    Suicidal thoughts 0 0 0    PHQ-9 Score 0 0  0     Difficult doing work/chores Not difficult at all  Not difficult at all       Data saved with a previous flowsheet row definition      11/25/2024   11:28 AM 11/21/2023    5:31 PM 02/13/2023   11:08 AM 01/18/2021    5:22 PM  GAD 7 : Generalized Anxiety Score  Nervous, Anxious, on Edge 0 0 0 0  Control/stop worrying 0 0 0 0  Worry too much - different things 0 0 0 0  Trouble relaxing 0 0 0 0  Restless 0 0 0 0  Easily annoyed or irritable 0 0 0 0  Afraid - awful might happen 0 0 0 0  Total GAD 7 Score 0 0 0 0   Anxiety Difficulty Not difficult at all  Not difficult at all     The patient does not have a history of falls. I did not complete a risk assessment for falls. A plan of care for falls was not documented.   Past Medical History:  Past Medical History:  Diagnosis Date   History of abnormal cervical Pap smear    Moderate persistent asthma without complication    followed by pcp   OAB (overactive bladder)    Seasonal allergies    SUI (stress urinary incontinence, female)    Uterovaginal prolapse, incomplete    Wears glasses     Surgical History:  Past Surgical History:  Procedure Laterality Date   ABDOMINAL HYSTERECTOMY     APPENDECTOMY  09/25/2001   @[facility]  by dr [email or web address]. claudius;  open   CYSTOSCOPY N/A 01/02/2024   Procedure: CYSTOSCOPY;  Surgeon:  Marilynne Rosaline SAILOR, MD;  Location: [facility];  Service: Gynecology;  Laterality: N/A;   WISDOM TOOTH EXTRACTION     XI ROBOTIC ASSISTED TOTAL HYSTERECTOMY WITH SACROCOLPOPEXY N/A 01/02/2024   Procedure: XI ROBOTIC ASSISTED TOTAL HYSTERECTOMY WITH BILATERAL SALPINGECOMY AND SACROCOLPOPEXY;  Surgeon: Marilynne Rosaline SAILOR, MD;  Location: [facility];  Service: Gynecology;  Laterality: N/A;  Total time requested is 3 hours    Medications:  Current Outpatient Medications on File Prior to Visit  Medication Sig   cetirizine  (ZYRTEC ) 10 MG tablet Take 1 tablet (10 mg total) by mouth at bedtime.   fluticasone  (FLOVENT  HFA) 110 MCG/ACT inhaler Inhale 2 puffs into the lungs in the morning and at bedtime.   Naphazoline HCl (CLEAR EYES OP) Apply to eye as needed.   VENTOLIN  HFA 108 (90 Base) MCG/ACT inhaler INHALE 1-2 PUFFS BY MOUTH EVERY 6 HOURS AS NEEDED FOR WHEEZE OR SHORTNESS OF BREATH   No current facility-administered medications on file prior to visit.    Allergies:  Allergies[1]  Social History:  Social History   Socioeconomic History   Marital status: Media Planner    Spouse name: Not on file    Number of children: Not on file   Years of education: Not on file   Highest education level: GED or equivalent  Occupational History   Occupation: Data Processing Manager  Tobacco Use   Smoking status: Former    Current packs/day: 0.00    Types: Cigarettes    Quit date: 11/21/2024    Years since quitting: 0.0   Smokeless tobacco: Never   Tobacco comments:    12-27-2023  quit smoking 01/ 2024 ,  started smoking age 64 (smoked 16 yrs)  Vaping Use   Vaping status: Former   Devices: quit 2023  Substance and Sexual Activity   Alcohol use: Never   Drug use: Not Currently    Types: Marijuana    Comment: 12-27-2023  pt stated last smoked past month   Sexual activity: Yes    Partners: Male    Birth control/protection: None  Other Topics Concern   Not on file  Social History Narrative   Not on file   Social Drivers of Health   Tobacco Use: Medium Risk (11/25/2024)   Patient History    Smoking Tobacco Use: Former    Smokeless Tobacco Use: Never    Passive Exposure: Not on file  Financial Resource Strain: Medium Risk (11/23/2024)   Overall Financial Resource Strain (CARDIA)    Difficulty of Paying Living Expenses: Somewhat hard  Food Insecurity: No Food Insecurity (11/23/2024)   Epic    Worried About Radiation Protection Practitioner of Food in the Last Year: Never true    Ran Out of Food in the Last Year: Never true  Transportation Needs: No Transportation Needs (11/23/2024)   Epic    Lack of Transportation (Medical): No    Lack of Transportation (Non-Medical): No  Physical Activity: Insufficiently Active (11/23/2024)   Exercise Vital Sign    Days of Exercise per Week: 1 day    Minutes of Exercise per Session: 20 min  Stress: No Stress Concern Present (11/23/2024)   [employer] of Occupational Health - Occupational Stress Questionnaire    Feeling of Stress: Not at all  Social Connections: Moderately Isolated (11/23/2024)   Social Connection and Isolation Panel    Frequency of Communication with Friends and  Family: Twice a week    Frequency of Social Gatherings with Friends and Family: Once a  week    Attends Religious Services: Never    Active Member of Clubs or Organizations: No    Attends Engineer, Structural: Not on file    Marital Status: Living with partner  Intimate Partner Violence: Not on file  Depression (PHQ2-9): Low Risk (11/25/2024)   Depression (PHQ2-9)    PHQ-2 Score: 0  Alcohol Screen: Low Risk (02/12/2023)   Alcohol Screen    Last Alcohol Screening Score (AUDIT): 0  Housing: High Risk (11/23/2024)   Epic    Unable to Pay for Housing in the Last Year: Yes    Number of Times Moved in the Last Year: 0    Homeless in the Last Year: No  Utilities: Not on file  Health Literacy: Not on file   Tobacco Use History[2] Social History   Substance and Sexual Activity  Alcohol Use Never    Family History:  Family History  Problem Relation Age of Onset   Seizures Mother    Ovarian cancer Mother    Cervical cancer Mother    Lupus Mother    Kidney disease Mother    Hypertension Father    Alcohol abuse Father    Hypertension Paternal Grandmother    Stroke Paternal Grandfather    Hypertension Paternal Grandfather    Heart disease Maternal Grandmother     Past medical history, surgical history, medications, allergies, family history and social history reviewed with patient today and changes made to appropriate areas of the chart.   Review of Systems  Constitutional: Negative.   HENT:  Positive for ear pain (clogged).   Eyes: Negative.   Respiratory: Negative.    Cardiovascular: Negative.   Gastrointestinal: Negative.   Genitourinary: Negative.   Musculoskeletal: Negative.   Skin: Negative.   Neurological: Negative.   Psychiatric/Behavioral: Negative.     All other ROS negative except what is listed above and in the HPI.      Objective:    BP 126/88 (BP Location: Left Arm, Patient Position: Sitting, Cuff Size: Normal)   Pulse 87   Temp (!) 97.1 F (36.2  C)   Ht 5' 2 (1.575 m)   Wt 213 lb (96.6 kg)   LMP 11/30/2023   SpO2 97%   BMI 38.96 kg/m   Wt Readings from Last 3 Encounters:  11/25/24 213 lb (96.6 kg)  01/02/24 210 lb (95.3 kg)  11/21/23 213 lb 9.6 oz (96.9 kg)    Physical Exam Vitals and nursing note reviewed.  Constitutional:      General: She is not in acute distress.    Appearance: Normal appearance.  HENT:     Head: Normocephalic and atraumatic.     Right Ear: Tympanic membrane, ear canal and external ear normal.     Left Ear: Tympanic membrane, ear canal and external ear normal.     Mouth/Throat:     Mouth: Mucous membranes are moist.     Pharynx: Posterior oropharyngeal erythema present. No oropharyngeal exudate.  Eyes:     Conjunctiva/sclera: Conjunctivae normal.  Cardiovascular:     Rate and Rhythm: Normal rate and regular rhythm.     Pulses: Normal pulses.     Heart sounds: Normal heart sounds.  Pulmonary:     Effort: Pulmonary effort is normal.     Breath sounds: Normal breath sounds.  Abdominal:     Palpations: Abdomen is soft.     Tenderness: There is no abdominal tenderness.  Musculoskeletal:        General: Normal range  of motion.     Cervical back: Normal range of motion and neck supple.     Right lower leg: No edema.     Left lower leg: No edema.  Lymphadenopathy:     Cervical: No cervical adenopathy.  Skin:    General: Skin is warm and dry.  Neurological:     General: No focal deficit present.     Mental Status: She is alert and oriented to person, place, and time.     Cranial Nerves: No cranial nerve deficit.     Coordination: Coordination normal.     Gait: Gait normal.  Psychiatric:        Mood and Affect: Mood normal.        Behavior: Behavior normal.        Thought Content: Thought content normal.        Judgment: Judgment normal.     Results for orders placed or performed during the hospital encounter of 01/02/24  Pregnancy, urine POC   Collection Time: 01/02/24  9:21 AM   Result Value Ref Range   Preg Test, Ur NEGATIVE NEGATIVE  Surgical pathology   Collection Time: 01/02/24  1:04 PM  Result Value Ref Range   SURGICAL PATHOLOGY      SURGICAL PATHOLOGY CASE: [id number] PATIENT: Amber Wilkins Surgical Pathology Report     Clinical History: Uterovaginal prolapse, incomplete (kc)     FINAL MICROSCOPIC DIAGNOSIS:  A. CERVIX, UTERUS, BILATERAL FALLOPIAN TUBES, RESECTION: Cervix:           Unremarkable.           Negative for dysplasia or malignancy.       Endocervix:           Nabothian cysts.           Negative for hyperplasia, atypia or malignancy.       Endometrium:           Benign proliferative endometrium.           Negative for hyperplasia, atypia or malignancy.       Myometrium:           Unremarkable.           Negative for malignancy.       Serosa:           Unremarkable.           Negative for malignancy.       Bilateral fallopian tubes:           Benign fimbriated fallopian tubes.           Negative for malignancy.    GROSS DESCRIPTION:  A. Specimen: Cervix, uterus, bilateral fallopian tubes: received fresh and placed in formalin. Specimen integrity: Intact.  Size and shape: 7.6 (S-I) x 5.5 (laterally) x 4.2 (A-P) cm; normal shape Weight: 89.0 g Serosa: Tan-purple, smooth and glistening. Cervix: 4.2 x 3.5 cm slightly bulging, tan-gray and glistening ectocervix; 2.3 cm long endocervix with herringbone architecture. Endometrium: Tan-brown, shaggy endometrium up to 0.3 cm thick; 3.5 cm long, 3.0 cm wide endometrial canal. Myometrium: Pale-tan and firm with faint trabecularity. Right fallopian tube: 7.5 cm long, 0.4 cm in diameter; tan-purple, glistening serosa; slightly edematous and congested cut surfaces. Left fallopian tube: 7.6 cm long, 0.4 cm in diameter; tan-purple, glistening serosa; unremarkable cut surfaces. Block Summary: A1: 12:00 and 6:00 cervix A2: Full-thickness anterior endomyometrium A3:  Full-thickness posterior endomyometrium A4: Right fallopian tube bisected fimbria and representative cross-section A5: Left fallopian tube bisected  fimbria and representative cross-section  SMB 01/03/24   Final Diagnos is performed by Pepper Dutton, MD.   Electronically signed 01/04/2024 Technical component performed at [facility], [address], [place], [place] [address].  Professional component performed at [facility]. [address], [place], [place] [address]  Immunohistochemistry Technical component (if applicable) was performed at [facility]. [address], [place], [place] [address].  IMMUNOHISTOCHEMISTRY DISCLAIMER (if applicable): Some of these immunohistochemical stains may have been developed and the performance characteristics determine by [facility]. Some may not have been cleared or approved by the U.S. Food and Drug Administration. The FDA has determined that such clearance or approval is not necessary. This test is used for clinical purposes. It should not be regarded as investigational or for research. This laboratory is certified under the Clinical Laboratory Improvement Amendme nts of 1988 (CLIA-88) as qualified to perform high complexity clinical laboratory testing.  The controls stained appropriately.       Assessment & Plan:   Problem List Items Addressed This Visit       Respiratory   Moderate persistent asthma without complication   Chronic, stable. Continue zyrtec  10mg  daily, flovent  2 puff BID, and ventolin  inhaler every 6 hours as needed. She declines prevnar 20 today.         Nervous and Auditory   Dysfunction of both eustachian tubes   Start flonase  nasal spray daily in addition to zyrtec  10mg  daily. Follow-up if not improving.         Other   Routine general medical examination at a health care facility - Primary   Health maintenance reviewed and  updated. Discussed nutrition, exercise. Check CMP, CBC today. Follow-up 1 year.        Relevant Orders   CBC with Differential/Platelet   Comprehensive metabolic panel with GFR   Obesity (BMI 30-39.9)   BMI 38.9. Discussed nutrition, exercise.       Other Visit Diagnoses       Screening for cardiovascular condition       Screen lipid panel today.   Relevant Orders   Lipid panel     Immunization due       Tdap given today.   Relevant Orders   Tdap vaccine greater than or equal to 7yo IM (Completed)        Follow up plan: Return in about 1 year (around 11/25/2025) for CPE.   LABORATORY TESTING:  - Pap smear: not applicable  IMMUNIZATIONS:   - Tdap: Tetanus vaccination status reviewed: Td vaccination indicated and given today. - Influenza: Declined - Pneumovax: Not applicable - Prevnar: Declined - HPV: Declined - Shingrix vaccine: Not applicable  SCREENING: -Mammogram: Not applicable  - Colonoscopy: Not applicable  - Bone Density: Not applicable   PATIENT COUNSELING:   Advised to take 1 mg of folate supplement per day if capable of pregnancy.   Sexuality: Discussed sexually transmitted diseases, partner selection, use of condoms, avoidance of unintended pregnancy  and contraceptive alternatives.   Advised to avoid cigarette smoking.  I discussed with the patient that most people either abstain from alcohol or drink within safe limits (<=14/week and <=4 drinks/occasion for males, <=7/weeks and <= 3 drinks/occasion for females) and that the risk for alcohol disorders and other health effects rises proportionally with the number of drinks per week and how often a drinker exceeds daily limits.  Discussed cessation/primary prevention of drug use and availability of treatment for abuse.  Diet: Encouraged to adjust caloric intake to maintain  or achieve ideal body weight, to reduce intake of dietary saturated fat and total fat, to limit sodium intake by avoiding high  sodium foods and not adding table salt, and to maintain adequate dietary potassium and calcium preferably from fresh fruits, vegetables, and low-fat dairy products.    stressed the importance of regular exercise  Injury prevention: Discussed safety belts, safety helmets, smoke detector, smoking near bedding or upholstery.   Dental health: Discussed importance of regular tooth brushing, flossing, and dental visits.    NEXT PREVENTATIVE PHYSICAL DUE IN 1 YEAR. Return in about 1 year (around 11/25/2025) for CPE.  Johniya Durfee A Tessy Pawelski     [1]  Allergies Allergen Reactions   Penicillins Hives and Nausea And Vomiting   Latex Rash    And irritation  [2]  Social History Tobacco Use  Smoking Status Former   Current packs/day: 0.00   Types: Cigarettes   Quit date: 11/21/2024   Years since quitting: 0.0  Smokeless Tobacco Never  Tobacco Comments   12-27-2023  quit smoking 01/ 2024 ,  started smoking age 51 (smoked 16 yrs)   "

## 2024-11-25 NOTE — Assessment & Plan Note (Signed)
 Chronic, stable. Continue zyrtec  10mg  daily, flovent  2 puff BID, and ventolin  inhaler every 6 hours as needed. She declines prevnar 20 today.

## 2024-11-25 NOTE — Assessment & Plan Note (Signed)
 Start flonase  nasal spray daily in addition to zyrtec  10mg  daily. Follow-up if not improving.

## 2024-11-25 NOTE — Assessment & Plan Note (Signed)
 BMI 38.9.  Discussed nutrition, exercise.

## 2024-11-26 ENCOUNTER — Other Ambulatory Visit: Payer: Self-pay

## 2024-11-26 MED ORDER — CETIRIZINE HCL 10 MG PO TABS: 10.0000 mg | ORAL_TABLET | Freq: Every day | ORAL | 3 refills | Status: AC

## 2024-11-26 NOTE — Telephone Encounter (Signed)
 Requesting: cetirizine  (ZYRTEC ) 10 MG tablet  Last Visit: 11/25/2024 Next Visit: Visit date not found Last Refill: 01/12/2024  Please Advise

## 2024-11-27 ENCOUNTER — Ambulatory Visit
Admission: RE | Admit: 2024-11-27 | Discharge: 2024-11-27 | Disposition: A | Discharge status: Home / Self Care | Source: Ambulatory Visit | Attending: Nurse Practitioner | Admitting: Nurse Practitioner

## 2024-11-27 ENCOUNTER — Ambulatory Visit: Payer: Self-pay | Admitting: Nurse Practitioner

## 2024-11-27 DIAGNOSIS — R7989 Other specified abnormal findings of blood chemistry: Secondary | ICD-10-CM

## 2024-12-02 ENCOUNTER — Encounter: Payer: Self-pay | Admitting: *Deleted
